# Patient Record
Sex: Male | Born: 1980 | Race: White | Hispanic: No | Marital: Married | Smoking: Never smoker
Health system: Southern US, Community
[De-identification: ages and names within clinical notes are randomized; demographics above are authoritative.]

## PROBLEM LIST (undated history)

## (undated) DIAGNOSIS — N200 Calculus of kidney: Secondary | ICD-10-CM

## (undated) DIAGNOSIS — B019 Varicella without complication: Secondary | ICD-10-CM

## (undated) HISTORY — DX: Varicella without complication: B01.9

## (undated) HISTORY — DX: Calculus of kidney: N20.0

---

## 2008-07-25 ENCOUNTER — Ambulatory Visit: Payer: Self-pay | Admitting: Family Medicine

## 2012-07-13 ENCOUNTER — Encounter: Payer: Self-pay | Admitting: Family Medicine

## 2012-08-03 ENCOUNTER — Ambulatory Visit (INDEPENDENT_AMBULATORY_CARE_PROVIDER_SITE_OTHER): Payer: BC Managed Care – PPO | Admitting: Family Medicine

## 2012-08-03 ENCOUNTER — Encounter: Payer: Self-pay | Admitting: Family Medicine

## 2012-08-03 VITALS — BP 110/76 | HR 65 | Temp 98.0°F | Resp 16 | Ht 72.5 in | Wt 190.6 lb

## 2012-08-03 DIAGNOSIS — Z23 Encounter for immunization: Secondary | ICD-10-CM

## 2012-08-03 DIAGNOSIS — Z0000 Encounter for general adult medical examination without abnormal findings: Secondary | ICD-10-CM

## 2012-08-03 NOTE — Patient Instructions (Addendum)
Your should receive a call or letter about your lab results within the next week to 10 days. If cholesterol is elevated, we can recheck it fasting (8 hours).   Keeping you healthy  Get these tests  Blood pressure- Have your blood pressure checked once a year by your healthcare provider.  Normal blood pressure is 120/80.  Weight- Have your body mass index (BMI) calculated to screen for obesity.  BMI is a measure of body fat based on height and weight. You can also calculate your own BMI at https://www.west-esparza.com/.  Cholesterol- Have your cholesterol checked regularly starting at age 59, sooner may be necessary if you have diabetes, high blood pressure, if a family member developed heart diseases at an early age or if you smoke.   Chlamydia, HIV, and other sexual transmitted disease- Get screened each year until the age of 11 then within three months of each new sexual partner.  Diabetes- Have your blood sugar checked regularly if you have high blood pressure, high cholesterol, a family history of diabetes or if you are overweight.  Get these vaccines  Flu shot- Every fall.  Tetanus shot- Every 10 years.  Menactra- Single dose; prevents meningitis.  Take these steps  Don't smoke- If you do smoke, ask your healthcare provider about quitting. For tips on how to quit, go to www.smokefree.gov or call 1-800-QUIT-NOW.  Be physically active- Exercise 5 days a week for at least 30 minutes.  If you are not already physically active start slow and gradually work up to 30 minutes of moderate physical activity.  Examples of moderate activity include walking briskly, mowing the yard, dancing, swimming bicycling, etc.  Eat a healthy diet- Eat a variety of healthy foods such as fruits, vegetables, low fat milk, low fat cheese, yogurt, lean meats, poultry, fish, beans, tofu, etc.  For more information on healthy eating, go to www.thenutritionsource.org  Drink alcohol in moderation- Limit alcohol  intake two drinks or less a day.  Never drink and drive.  Dentist- Brush and floss teeth twice daily; visit your dentis twice a year.  Depression-Your emotional health is as important as your physical health.  If you're feeling down, losing interest in things you normally enjoy please talk with your healthcare provider.  Gun Safety- If you keep a gun in your home, keep it unloaded and with the safety lock on.  Bullets should be stored separately.  Helmet use- Always wear a helmet when riding a motorcycle, bicycle, rollerblading or skateboarding.  Safe sex- If you may be exposed to a sexually transmitted infection, use a condom  Seat belts- Seat bels can save your life; always wear one.  Smoke/Carbon Monoxide detectors- These detectors need to be installed on the appropriate level of your home.  Replace batteries at least once a year.  Skin Cancer- When out in the sun, cover up and use sunscreen SPF 15 or higher.  Violence- If anyone is threatening or hurting you, please tell your healthcare provider.

## 2012-08-03 NOTE — Progress Notes (Signed)
  Subjective:    Patient ID: Benjamin Jarvis, male    DOB: Jan 12, 1981, 31 y.o.   MRN: [id number]  HPI    Review of Systems  Constitutional: Negative.   HENT: Negative.   Eyes: Negative.   Respiratory: Negative.   Cardiovascular: Negative.   Gastrointestinal: Negative.   Genitourinary: Negative.   Musculoskeletal: Negative.   Skin: Negative.   Neurological: Negative.   Hematological: Negative.   Psychiatric/Behavioral: Negative.        Objective:   Physical Exam        Assessment & Plan:

## 2012-08-03 NOTE — Progress Notes (Signed)
Subjective:    Patient ID: Benjamin Jarvis, male    DOB: Apr 06, 1981, 31 y.o.   MRN: [id number]  HPI Ikaika Bhatia is a 31 y.o. male Here for CPE.  No specific concerns. Last blood work - few years ago - was normal.  Last ate at 12:30.  No regular exercise - sometimes ultimate frisbee.   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  Firefighter.   Review of Systems  All other systems reviewed and are negative.  see CMA note and I reviewed 13 point on PHS - no positive responses.      Objective:   Physical Exam  Constitutional: He is oriented to person, place, and time. He appears well-developed and well-nourished.  HENT:  Head: Normocephalic and atraumatic.  Right Ear: External ear normal.  Left Ear: External ear normal.  Mouth/Throat: Oropharynx is clear and moist.  Eyes: Conjunctivae normal and EOM are normal. Pupils are equal, round, and reactive to light.  Neck: Normal range of motion. Neck supple. No thyromegaly present.  Cardiovascular: Normal rate, regular rhythm, normal heart sounds and intact distal pulses.   Pulmonary/Chest: Effort normal and breath sounds normal. No respiratory distress. He has no wheezes.  Abdominal: Soft. He exhibits no distension. There is no tenderness. Hernia confirmed negative in the right inguinal area and confirmed negative in the left inguinal area.  Genitourinary: Testes normal and penis normal. Right testis shows no mass and no tenderness. Left testis shows no tenderness.  Musculoskeletal: Normal range of motion. He exhibits no edema and no tenderness.  Lymphadenopathy:    He has no cervical adenopathy.  Neurological: He is alert and oriented to person, place, and time. He has normal reflexes.  Skin: Skin is warm and dry.  Psychiatric: He has a normal mood and affect. His behavior is normal.        Assessment & Plan:  Kaige Evins is a 31 y.o. male 1. Routine general medical examination at a health care facility  Tdap vaccine greater than or  equal to 7yo IM, CBC, Comprehensive metabolic panel, Lipid panel    CPE - declined flu vaccine.   tdap given today. Anticipatory guidance below.  No concerns identified. Check labs above.  May need to recheck lipids 8 hours fasting if elevated.   Discussed increase exercise.    Patient Instructions  Your should receive a call or letter about your lab results within the next week to 10 days. If cholesterol is elevated, we can recheck it fasting (8 hours).   Keeping you healthy  Get these tests  Blood pressure- Have your blood pressure checked once a year by your healthcare provider.  Normal blood pressure is 120/80.  Weight- Have your body mass index (BMI) calculated to screen for obesity.  BMI is a measure of body fat based on height and weight. You can also calculate your own BMI at https://www.west-esparza.com/.  Cholesterol- Have your cholesterol checked regularly starting at age 35, sooner may be necessary if you have diabetes, high blood pressure, if a family member developed heart diseases at an early age or if you smoke.   Chlamydia, HIV, and other sexual transmitted disease- Get screened each year until the age of 38 then within three months of each new sexual partner.  Diabetes- Have your blood sugar checked regularly if you have high blood pressure, high cholesterol, a family history of diabetes or if you are overweight.  Get these vaccines  Flu shot- Every fall.  Tetanus shot- Every 10 years.  Menactra-  Single dose; prevents meningitis.  Take these steps  Don't smoke- If you do smoke, ask your healthcare provider about quitting. For tips on how to quit, go to www.smokefree.gov or call 1-800-QUIT-NOW.  Be physically active- Exercise 5 days a week for at least 30 minutes.  If you are not already physically active start slow and gradually work up to 30 minutes of moderate physical activity.  Examples of moderate activity include walking briskly, mowing the yard, dancing,  swimming bicycling, etc.  Eat a healthy diet- Eat a variety of healthy foods such as fruits, vegetables, low fat milk, low fat cheese, yogurt, lean meats, poultry, fish, beans, tofu, etc.  For more information on healthy eating, go to www.thenutritionsource.org  Drink alcohol in moderation- Limit alcohol intake two drinks or less a day.  Never drink and drive.  Dentist- Brush and floss teeth twice daily; visit your dentis twice a year.  Depression-Your emotional health is as important as your physical health.  If you're feeling down, losing interest in things you normally enjoy please talk with your healthcare provider.  Gun Safety- If you keep a gun in your home, keep it unloaded and with the safety lock on.  Bullets should be stored separately.  Helmet use- Always wear a helmet when riding a motorcycle, bicycle, rollerblading or skateboarding.  Safe sex- If you may be exposed to a sexually transmitted infection, use a condom  Seat belts- Seat bels can save your life; always wear one.  Smoke/Carbon Monoxide detectors- These detectors need to be installed on the appropriate level of your home.  Replace batteries at least once a year.  Skin Cancer- When out in the sun, cover up and use sunscreen SPF 15 or higher.  Violence- If anyone is threatening or hurting you, please tell your healthcare provider.

## 2012-08-04 LAB — LIPID PANEL
Cholesterol: 216 mg/dL — ABNORMAL HIGH (ref 0–200)
HDL: 49 mg/dL
LDL Cholesterol: 146 mg/dL — ABNORMAL HIGH (ref 0–99)
Total CHOL/HDL Ratio: 4.4 ratio
Triglycerides: 105 mg/dL
VLDL: 21 mg/dL (ref 0–40)

## 2012-08-04 LAB — COMPREHENSIVE METABOLIC PANEL
ALT: 21 U/L (ref 0–53)
CO2: 29 mEq/L (ref 19–32)
Sodium: 139 mEq/L (ref 135–145)
Total Bilirubin: 0.6 mg/dL (ref 0.3–1.2)
Total Protein: 7.7 g/dL (ref 6.0–8.3)

## 2012-08-04 LAB — CBC
MCH: 30.4 pg (ref 26.0–34.0)
Platelets: 218 10*3/uL (ref 150–400)
RBC: 5.14 MIL/uL (ref 4.22–5.81)
WBC: 5.1 10*3/uL (ref 4.0–10.5)

## 2012-10-04 ENCOUNTER — Ambulatory Visit: Payer: BC Managed Care – PPO | Admitting: Internal Medicine

## 2012-10-04 ENCOUNTER — Ambulatory Visit: Payer: BC Managed Care – PPO

## 2012-10-04 VITALS — BP 126/80 | HR 83 | Temp 97.8°F | Resp 16 | Ht 73.5 in | Wt 192.2 lb

## 2012-10-04 DIAGNOSIS — R079 Chest pain, unspecified: Secondary | ICD-10-CM

## 2012-10-04 NOTE — Progress Notes (Signed)
  Subjective:    Patient ID: Benjamin Jarvis, male    DOB: 1981-08-16, 32 y.o.   MRN: [id number]  HPI complaining of chest pain started last night while he was sleeping Could not find a comfortable position except for leaning forward No shortness of breath No cough No palpitations/no diaphoresis/no nausea or vomiting Got better as he moves around walking his dog but then worse as he was sitting in church. No history of injury although was moving heavy furniture on Friday night Main area of discomfort is the left anterolateral chest area below the nipple  No medications Nonsmoker  Review of Systems No recent illnesses in the last month No fatigue or weight loss no fever or night sweats No nausea or vomiting     Objective:   Physical Exam No acute distress although in obvious discomfort Vital signs stable HEENT clear Heart regular without murmur Lungs clear with full breath sounds in all areas Thoracic spine nontender/paraspinous muscles nontender He is not actually tender over the chest wall where he notices his pain   [facility] reading (PRIMARY) by  Dr. Merla Riches no pneumothorax, infiltrate, or effusion. No rib abnormality.  EKG normal with no signs of pericarditis       Assessment & Plan:  Problem #1 chest pain Etiology unclear Advise that is likely musculoskeletal and should be treated with heat and 800 mg of ibuprofen 3 times a day with close observation for any other symptoms

## 2014-09-27 ENCOUNTER — Ambulatory Visit (INDEPENDENT_AMBULATORY_CARE_PROVIDER_SITE_OTHER): Payer: BC Managed Care – PPO | Admitting: Family Medicine

## 2014-09-27 VITALS — BP 118/72 | HR 97 | Temp 98.2°F | Resp 16 | Ht 73.25 in | Wt 190.0 lb

## 2014-09-27 DIAGNOSIS — N509 Disorder of male genital organs, unspecified: Secondary | ICD-10-CM

## 2014-09-27 DIAGNOSIS — R102 Pelvic and perineal pain: Secondary | ICD-10-CM

## 2014-09-27 DIAGNOSIS — N410 Acute prostatitis: Secondary | ICD-10-CM

## 2014-09-27 DIAGNOSIS — R319 Hematuria, unspecified: Secondary | ICD-10-CM

## 2014-09-27 LAB — POCT UA - MICROSCOPIC ONLY
BACTERIA, U MICROSCOPIC: NEGATIVE
CRYSTALS, UR, HPF, POC: NEGATIVE
Casts, Ur, LPF, POC: NEGATIVE
Mucus, UA: NEGATIVE
YEAST UA: NEGATIVE

## 2014-09-27 LAB — POCT URINALYSIS DIPSTICK
Bilirubin, UA: NEGATIVE
GLUCOSE UA: NEGATIVE
KETONES UA: NEGATIVE
Leukocytes, UA: NEGATIVE
Nitrite, UA: NEGATIVE
Protein, UA: NEGATIVE
SPEC GRAV UA: 1.01
UROBILINOGEN UA: 0.2
pH, UA: 6.5

## 2014-09-27 LAB — POCT CBC
Granulocyte percent: 70.1 %G (ref 37–80)
HCT, POC: 48.8 % (ref 43.5–53.7)
HEMOGLOBIN: 16.1 g/dL (ref 14.1–18.1)
LYMPH, POC: 2.2 (ref 0.6–3.4)
MCH, POC: 29.9 pg (ref 27–31.2)
MCHC: 33.1 g/dL (ref 31.8–35.4)
MCV: 90.5 fL (ref 80–97)
MID (cbc): 0.4 (ref 0–0.9)
MPV: 8.2 fL (ref 0–99.8)
POC GRANULOCYTE: 6 (ref 2–6.9)
POC LYMPH PERCENT: 25.6 %L (ref 10–50)
POC MID %: 4.3 % (ref 0–12)
Platelet Count, POC: 221 10*3/uL (ref 142–424)
RBC: 5.39 M/uL (ref 4.69–6.13)
RDW, POC: 12.5 %
WBC: 8.5 10*3/uL (ref 4.6–10.2)

## 2014-09-27 MED ORDER — CIPROFLOXACIN HCL 500 MG PO TABS: 500.0000 mg | ORAL_TABLET | Freq: Two times a day (BID) | ORAL | Status: DC

## 2014-09-27 NOTE — Progress Notes (Signed)
Subjective:  This chart was scribed for Benjamin Staggers, MD by Benjamin Jarvis, ED Scribe. This patient was seen at [facility] and the patient's care was started at 7:56 PM   Patient ID: Benjamin Jarvis, male    DOB: 04-14-1981, 33 y.o.   MRN: [id number]  HPI  HPI Comments: [name] is a 33 y.o. male who presents to the Emergency Department complaining of intermittent pain between his anus and his scrotum that radiates to his testicles, started 2 days ago and became worse last night. He describes episodes as stabbing pain that occur once an hour and become worse with coughing. Pt states dysuria as an associated symptom. He denies a history of similar symptoms and reports that current pain is not consistent with his history of kidney stones. Pt is married and denies partners outside of the marriage. He denies recent travel. Pt also denies fever, hematuria, penile discharge and pain with BM as associated symptoms.   There are no active problems to display for this patient.  History reviewed. No pertinent past medical history. History reviewed. No pertinent past surgical history. No Known Allergies Prior to Admission medications   Not on File   History   Social History  . Marital Status: Married    Spouse Name: N/A    Number of Children: N/A  . Years of Education: N/A   Occupational History  . Not on file.   Social History Main Topics  . Smoking status: Never Smoker   . Smokeless tobacco: Not on file  . Alcohol Use: Yes  . Drug Use: No  . Sexual Activity: Not on file   Other Topics Concern  . Not on file   Social History Narrative    Review of Systems  Constitutional: Negative for fever.  Genitourinary: Positive for dysuria and testicular pain. Negative for hematuria and discharge.  All other systems reviewed and are negative.      Objective:   Physical Exam  Constitutional: He appears well-developed and well-nourished. No distress.  HENT:  Head: Normocephalic and  atraumatic.  Eyes: Conjunctivae and EOM are normal.  Neck: Neck supple. No tracheal deviation present.  Cardiovascular: Normal rate.   Pulmonary/Chest: Effort normal. No respiratory distress.  Abdominal: Soft. There is no tenderness.  No CVA tenderness; abdomen soft and non-tender, but feels sensation in perineal area with pressure on lower abdomen  Genitourinary:  Testicles non-tender; no rash; no penile discharge; no inguinal LAD  Skin: Skin is warm and dry.  Psychiatric: He has a normal mood and affect. His behavior is normal.  Nursing note and vitals reviewed.      Filed Vitals:   09/27/14 1905  BP: 118/72  Pulse: 97  Temp: 98.2 F (36.8 C)  TempSrc: Oral  Resp: 16  Height: 6' 1.25" (1.861 m)  Weight: 190 lb (86.183 kg)  SpO2: 97%   Results for orders placed or performed in visit on 09/27/14  POCT urinalysis dipstick  Result Value Ref Range   Color, UA yellow    Clarity, UA clear    Glucose, UA neg    Bilirubin, UA neg    Ketones, UA neg    Spec Grav, UA 1.010    Blood, UA trace    pH, UA 6.5    Protein, UA neg    Urobilinogen, UA 0.2    Nitrite, UA neg    Leukocytes, UA Negative   POCT UA - Microscopic Only  Result Value Ref Range   WBC, Ur, HPF, POC  0-1    RBC, urine, microscopic 0-2    Bacteria, U Microscopic neg    Mucus, UA neg    Epithelial cells, urine per micros 0-1    Crystals, Ur, HPF, POC neg    Casts, Ur, LPF, POC neg    Yeast, UA neg   POCT CBC  Result Value Ref Range   WBC 8.5 4.6 - 10.2 K/uL   Lymph, poc 2.2 0.6 - 3.4   POC LYMPH PERCENT 25.6 10 - 50 %L   MID (cbc) 0.4 0 - 0.9   POC MID % 4.3 0 - 12 %M   POC Granulocyte 6.0 2 - 6.9   Granulocyte percent 70.1 37 - 80 %G   RBC 5.39 4.69 - 6.13 M/uL   Hemoglobin 16.1 14.1 - 18.1 g/dL   HCT, POC 08.648.8 57.843.5 - 53.7 %   MCV 90.5 80 - 97 fL   MCH, POC 29.9 27 - 31.2 pg   MCHC 33.1 31.8 - 35.4 g/dL   RDW, POC 46.912.5 %   Platelet Count, POC 221 142 - 424 K/uL   MPV 8.2 0 - 99.8 fL         Assessment & Plan:   Benjamin AlcideDaniel Marcello is a 33 y.o. male Hematuria - Plan: POCT urinalysis dipstick, POCT UA - Microscopic Only  Acute prostatitis - Plan: ciprofloxacin (CIPRO) 500 MG tablet, PSA, POCT CBC  Perineal pain in male - Plan: POCT CBC  Suspected acute prostatitis by hx and urinary symptoms. Afebrile and without retention symptoms.   -start cipro, push fluids, check PSA.   -rtc precautions discussed if any worsening or abdominal symptoms.   Meds ordered this encounter  Medications  . ciprofloxacin (CIPRO) 500 MG tablet    Sig: Take 1 tablet (500 mg total) by mouth 2 (two) times daily.    Dispense:  20 tablet    Refill:  0   Patient Instructions  Your pain appears to be due to acute prostate infection. Start Cipro, we will check PSA blood test. If not improving in next 3-4 days, or any worsening sooner - return for recheck.  Return to the clinic or go to the nearest emergency room if any of your symptoms worsen or new symptoms occur.  Prostatitis The prostate gland is about the size and shape of a walnut. It is located just below your bladder. It produces one of the components of semen, which is made up of sperm and the fluids that help nourish and transport it out from the testicles. Prostatitis is inflammation of the prostate gland.  There are four types of prostatitis:  Acute bacterial prostatitis. This is the least common type of prostatitis. It starts quickly and usually is associated with a bladder infection, high fever, and shaking chills. It can occur at any age.  Chronic bacterial prostatitis. This is a persistent bacterial infection in the prostate. It usually develops from repeated acute bacterial prostatitis or acute bacterial prostatitis that was not properly treated. It can occur in men of any age but is most common in middle-aged men whose prostate has begun to enlarge. The symptoms are not as severe as those in acute bacterial prostatitis. Discomfort in the part  of your body that is in front of your rectum and below your scrotum (perineum), lower abdomen, or in the head of your penis (glans) may represent your primary discomfort.  Chronic prostatitis (nonbacterial). This is the most common type of prostatitis. It is inflammation of the prostate gland that  is not caused by a bacterial infection. The cause is unknown and may be associated with a viral infection or autoimmune disorder.  Prostatodynia (pelvic floor disorder). This is associated with increased muscular tone in the pelvis surrounding the prostate. CAUSES The causes of bacterial prostatitis are bacterial infection. The causes of the other types of prostatitis are unknown.  SYMPTOMS  Symptoms can vary depending upon the type of prostatitis that exists. There can also be overlap in symptoms. Possible symptoms for each type of prostatitis are listed below. Acute Bacterial Prostatitis  Painful urination.  Fever or chills.  Muscle or joint pains.  Low back pain.  Low abdominal pain.  Inability to empty bladder completely. Chronic Bacterial Prostatitis, Chronic Nonbacterial Prostatitis, and Prostatodynia  Sudden urge to urinate.  Frequent urination.  Difficulty starting urine stream.  Weak urine stream.  Discharge from the urethra.  Dribbling after urination.  Rectal pain.  Pain in the testicles, penis, or tip of the penis.  Pain in the perineum.  Problems with sexual function.  Painful ejaculation.  Bloody semen. DIAGNOSIS  In order to diagnose prostatitis, your health care provider will ask about your symptoms. One or more urine samples will be taken and tested (urinalysis). If the urinalysis result is negative for bacteria, your health care provider may use a finger to feel your prostate (digital rectal exam). This exam helps your health care provider determine if your prostate is swollen and tender. It will also produce a specimen of semen that can be  analyzed. TREATMENT  Treatment for prostatitis depends on the cause. If a bacterial infection is the cause, it can be treated with antibiotic medicine. In cases of chronic bacterial prostatitis, the use of antibiotics for up to 1 month or 6 weeks may be necessary. Your health care provider may instruct you to take sitz baths to help relieve pain. A sitz bath is a bath of hot water in which your hips and buttocks are under water. This relaxes the pelvic floor muscles and often helps to relieve the pressure on your prostate. HOME CARE INSTRUCTIONS   Take all medicines as directed by your health care provider.  Take sitz baths as directed by your health care provider. SEEK MEDICAL CARE IF:   Your symptoms get worse, not better.  You have a fever. SEEK IMMEDIATE MEDICAL CARE IF:   You have chills.  You feel nauseous or vomit.  You feel lightheaded or faint.  You are unable to urinate.  You have blood or blood clots in your urine. MAKE SURE YOU:  Understand these instructions.  Will watch your condition.  Will get help right away if you are not doing well or get worse. Document Released: 09/13/2000 Document Revised: 09/21/2013 Document Reviewed: 04/05/2013 [facility] Patient Information 2015 [place], [place]. This information is not intended to replace advice given to you by your health care provider. Make sure you discuss any questions you have with your health care provider.     I personally performed the services described in this documentation, which was scribed in my presence. The recorded information has been reviewed and considered, and addended by me as needed.

## 2014-09-27 NOTE — Patient Instructions (Signed)
Your pain appears to be due to acute prostate infection. Start Cipro, we will check PSA blood test. If not improving in next 3-4 days, or any worsening sooner - return for recheck.  Return to the clinic or go to the nearest emergency room if any of your symptoms worsen or new symptoms occur.  Prostatitis The prostate gland is about the size and shape of a walnut. It is located just below your bladder. It produces one of the components of semen, which is made up of sperm and the fluids that help nourish and transport it out from the testicles. Prostatitis is inflammation of the prostate gland.  There are four types of prostatitis:  Acute bacterial prostatitis. This is the least common type of prostatitis. It starts quickly and usually is associated with a bladder infection, high fever, and shaking chills. It can occur at any age.  Chronic bacterial prostatitis. This is a persistent bacterial infection in the prostate. It usually develops from repeated acute bacterial prostatitis or acute bacterial prostatitis that was not properly treated. It can occur in men of any age but is most common in middle-aged men whose prostate has begun to enlarge. The symptoms are not as severe as those in acute bacterial prostatitis. Discomfort in the part of your body that is in front of your rectum and below your scrotum (perineum), lower abdomen, or in the head of your penis (glans) may represent your primary discomfort.  Chronic prostatitis (nonbacterial). This is the most common type of prostatitis. It is inflammation of the prostate gland that is not caused by a bacterial infection. The cause is unknown and may be associated with a viral infection or autoimmune disorder.  Prostatodynia (pelvic floor disorder). This is associated with increased muscular tone in the pelvis surrounding the prostate. CAUSES The causes of bacterial prostatitis are bacterial infection. The causes of the other types of prostatitis are  unknown.  SYMPTOMS  Symptoms can vary depending upon the type of prostatitis that exists. There can also be overlap in symptoms. Possible symptoms for each type of prostatitis are listed below. Acute Bacterial Prostatitis  Painful urination.  Fever or chills.  Muscle or joint pains.  Low back pain.  Low abdominal pain.  Inability to empty bladder completely. Chronic Bacterial Prostatitis, Chronic Nonbacterial Prostatitis, and Prostatodynia  Sudden urge to urinate.  Frequent urination.  Difficulty starting urine stream.  Weak urine stream.  Discharge from the urethra.  Dribbling after urination.  Rectal pain.  Pain in the testicles, penis, or tip of the penis.  Pain in the perineum.  Problems with sexual function.  Painful ejaculation.  Bloody semen. DIAGNOSIS  In order to diagnose prostatitis, your health care provider will ask about your symptoms. One or more urine samples will be taken and tested (urinalysis). If the urinalysis result is negative for bacteria, your health care provider may use a finger to feel your prostate (digital rectal exam). This exam helps your health care provider determine if your prostate is swollen and tender. It will also produce a specimen of semen that can be analyzed. TREATMENT  Treatment for prostatitis depends on the cause. If a bacterial infection is the cause, it can be treated with antibiotic medicine. In cases of chronic bacterial prostatitis, the use of antibiotics for up to 1 month or 6 weeks may be necessary. Your health care provider may instruct you to take sitz baths to help relieve pain. A sitz bath is a bath of hot water in which your  hips and buttocks are under water. This relaxes the pelvic floor muscles and often helps to relieve the pressure on your prostate. HOME CARE INSTRUCTIONS   Take all medicines as directed by your health care provider.  Take sitz baths as directed by your health care provider. SEEK MEDICAL  CARE IF:   Your symptoms get worse, not better.  You have a fever. SEEK IMMEDIATE MEDICAL CARE IF:   You have chills.  You feel nauseous or vomit.  You feel lightheaded or faint.  You are unable to urinate.  You have blood or blood clots in your urine. MAKE SURE YOU:  Understand these instructions.  Will watch your condition.  Will get help right away if you are not doing well or get worse. Document Released: 09/13/2000 Document Revised: 09/21/2013 Document Reviewed: 04/05/2013 [facility] Patient Information 2015 [place], [place]. This information is not intended to replace advice given to you by your health care provider. Make sure you discuss any questions you have with your health care provider.

## 2014-09-29 LAB — PSA: PSA: 0.56 ng/mL (ref <=4.00)

## 2014-10-01 ENCOUNTER — Telehealth: Payer: Self-pay

## 2014-10-01 NOTE — Telephone Encounter (Signed)
Patient was advised to come in a be seen today but he would like to receive a call from Dr. Neva Seat before he comes. Please call patient! [phone]

## 2014-10-02 ENCOUNTER — Encounter: Payer: Self-pay | Admitting: Family Medicine

## 2014-10-02 ENCOUNTER — Ambulatory Visit (INDEPENDENT_AMBULATORY_CARE_PROVIDER_SITE_OTHER): Payer: 59 | Admitting: Family Medicine

## 2014-10-02 VITALS — BP 118/76 | HR 96 | Temp 98.0°F | Resp 18 | Ht 73.75 in | Wt 187.4 lb

## 2014-10-02 DIAGNOSIS — N509 Disorder of male genital organs, unspecified: Secondary | ICD-10-CM

## 2014-10-02 DIAGNOSIS — J029 Acute pharyngitis, unspecified: Secondary | ICD-10-CM

## 2014-10-02 DIAGNOSIS — J039 Acute tonsillitis, unspecified: Secondary | ICD-10-CM

## 2014-10-02 DIAGNOSIS — R102 Pelvic and perineal pain: Secondary | ICD-10-CM

## 2014-10-02 DIAGNOSIS — R509 Fever, unspecified: Secondary | ICD-10-CM

## 2014-10-02 LAB — POCT CBC
Granulocyte percent: 76.8 %G (ref 37–80)
HEMATOCRIT: 46.1 % (ref 43.5–53.7)
Hemoglobin: 15.1 g/dL (ref 14.1–18.1)
Lymph, poc: 1.4 (ref 0.6–3.4)
MCH, POC: 29.5 pg (ref 27–31.2)
MCHC: 32.7 g/dL (ref 31.8–35.4)
MCV: 90.2 fL (ref 80–97)
MID (CBC): 0.6 (ref 0–0.9)
MPV: 7.3 fL (ref 0–99.8)
POC Granulocyte: 6.4 (ref 2–6.9)
POC LYMPH PERCENT: 16.3 %L (ref 10–50)
POC MID %: 6.9 %M (ref 0–12)
Platelet Count, POC: 191 10*3/uL (ref 142–424)
RBC: 5.11 M/uL (ref 4.69–6.13)
RDW, POC: 12.1 %
WBC: 8.3 10*3/uL (ref 4.6–10.2)

## 2014-10-02 LAB — POCT RAPID STREP A (OFFICE): RAPID STREP A SCREEN: NEGATIVE

## 2014-10-02 MED ORDER — AMOXICILLIN 500 MG PO CAPS: 500.0000 mg | ORAL_CAPSULE | Freq: Three times a day (TID) | ORAL | Status: DC

## 2014-10-02 NOTE — Progress Notes (Signed)
Subjective:    Patient ID: Benjamin Jarvis, male    DOB: 04-23-1981, 34 y.o.   MRN: [id number] This chart was scribed for [name], MD by Jolene Provost, Medical Scribe. This patient was seen in Room 2 and the patient's care was started a 11:57 AM.  HPI  HPI Comments: Benjamin Jarvis is a 34 y.o. male who presents to [facility] complaining of sore throat that started one week ago. Pt also states he has been congested and coughing for the last three weeks. Pt endorses fever (101.1, two days ago), congestion, diaphoresis, and sleep disturbance. Pt has taken ibuprofen, sudafed, benadryl, and Ambien once without relief.   Pt was last seen five days ago with perineal pain. Normal UA, but based on description sounded like acute prostatitis. Started on cipro 500mg  for 10 days. PSA did return as being normal. Pt states his perineal pain has resolved - resolved after 1st dose or two of antibiotic.  Pt denies pain with wiping anus, or blood in stool or with wiping.   Review of Systems  Constitutional: Positive for fever, chills and diaphoresis.  HENT: Positive for congestion and sore throat.   Respiratory: Positive for cough.   Psychiatric/Behavioral: Positive for sleep disturbance.       Objective:   Physical Exam  Constitutional: He is oriented to person, place, and time. He appears well-developed and well-nourished.  HENT:  Head: Normocephalic and atraumatic.  Right Ear: Tympanic membrane, external ear and ear canal normal.  Left Ear: Tympanic membrane, external ear and ear canal normal.  Nose: No rhinorrhea.  Mouth/Throat: Mucous membranes are normal. No posterior oropharyngeal edema or posterior oropharyngeal erythema.  Tonsils 1-2+, erythematous with exudates.  Eyes: Conjunctivae are normal. Pupils are equal, round, and reactive to light.  Neck: Neck supple.  Slightly enlarged submandibular lymph node. No other adenopathy.  Cardiovascular: Normal rate, regular rhythm, normal heart sounds  and intact distal pulses.   No murmur heard. Pulmonary/Chest: Effort normal and breath sounds normal. No respiratory distress. He has no wheezes. He has no rhonchi. He has no rales.  Abdominal: Soft. There is no hepatosplenomegaly. There is no tenderness.  Lymphadenopathy:    He has no cervical adenopathy.    He has no axillary adenopathy.       Right: No supraclavicular and no epitrochlear adenopathy present.       Left: No supraclavicular and no epitrochlear adenopathy present.  Neurological: He is alert and oriented to person, place, and time.  Skin: Skin is warm and dry. No rash noted.  Psychiatric: He has a normal mood and affect. His behavior is normal.  Vitals reviewed. adherent white exudate -L greater than right tonsilar.    Filed Vitals:   10/02/14 1113  BP: 118/76  Pulse: 96  Temp: 98 F (36.7 C)  TempSrc: Oral  Resp: 18  Height: 6' 1.75" (1.873 m)  Weight: 187 lb 6.4 oz (85.004 kg)  SpO2: 98%   Results for orders placed or performed in visit on 10/02/14  POCT rapid strep A  Result Value Ref Range   Rapid Strep A Screen Negative Negative  POCT CBC  Result Value Ref Range   WBC 8.3 4.6 - 10.2 K/uL   Lymph, poc 1.4 0.6 - 3.4   POC LYMPH PERCENT 16.3 10 - 50 %L   MID (cbc) 0.6 0 - 0.9   POC MID % 6.9 0 - 12 %M   POC Granulocyte 6.4 2 - 6.9   Granulocyte percent 76.8 37 -  80 %G   RBC 5.11 4.69 - 6.13 M/uL   Hemoglobin 15.1 14.1 - 18.1 g/dL   HCT, POC 40.9 81.1 - 53.7 %   MCV 90.2 80 - 97 fL   MCH, POC 29.5 27 - 31.2 pg   MCHC 32.7 31.8 - 35.4 g/dL   RDW, POC 91.4 %   Platelet Count, POC 191 142 - 424 K/uL   MPV 7.3 0 - 99.8 fL        Assessment & Plan:   Benjamin Jarvis is a 34 y.o. male Sore throat - Plan: POCT rapid strep A, Culture, Group A Strep, POCT CBC, Epstein-Barr virus VCA antibody panel, amoxicillin (AMOXIL) 500 MG capsule, Tonsillitis - Plan: Epstein-Barr virus VCA antibody panel, Fever, unspecified fever cause - Plan: POCT rapid strep A,  Culture, Group A Strep, POCT CBC, Epstein-Barr virus VCA antibody panel, amoxicillin (AMOXIL) 500 MG capsule  - exudative tonsillitis. Possible false negative rapid strep vs viral, including mono. Equivocal CBC.   -check throat cx, and EBV titer, start amox (warned about rash if mono - rtc if this occurs), sx care and RTC precautions.   Perineal pain in male  - now resolved, and with quick resolution and nl PSA, unlikely prostatitis. Did admit to possible hemorrhoid, but asx now.   -if pain recurs - especially off abx - rtc for recheck and likely repeat exam and rectal exam.  Sooner if worse.    Meds ordered this encounter  Medications  . amoxicillin (AMOXIL) 500 MG capsule    Sig: Take 1 capsule (500 mg total) by mouth 3 (three) times daily.    Dispense:  30 capsule    Refill:  0   Patient Instructions  Stop Cipro as your prostate test was normal and symptoms have resolved.  If the pain returns - recheck to determine cause.   For your throat: I will check a mono test, and throat culture to see if other type of strep throat. Can start amoxicillin in case this is strep throat, but if any rash - this could be due to mono - return if this occurs. Return to the clinic or go to the nearest emergency room if any of your symptoms worsen or new symptoms occur.   Sore Throat A sore throat is pain, burning, irritation, or scratchiness of the throat. There is often pain or tenderness when swallowing or talking. A sore throat may be accompanied by other symptoms, such as coughing, sneezing, fever, and swollen neck glands. A sore throat is often the first sign of another sickness, such as a cold, flu, strep throat, or mononucleosis (commonly known as mono). Most sore throats go away without medical treatment. CAUSES  The most common causes of a sore throat include:  A viral infection, such as a cold, flu, or mono.  A bacterial infection, such as strep throat, tonsillitis, or whooping  cough.  Seasonal allergies.  Dryness in the air.  Irritants, such as smoke or pollution.  Gastroesophageal reflux disease (GERD). HOME CARE INSTRUCTIONS   Only take over-the-counter medicines as directed by your caregiver.  Drink enough fluids to keep your urine clear or pale yellow.  Rest as needed.  Try using throat sprays, lozenges, or sucking on hard candy to ease any pain (if older than 4 years or as directed).  Sip warm liquids, such as broth, herbal tea, or warm water with honey to relieve pain temporarily. You may also eat or drink cold or frozen liquids such as frozen  ice pops.  Gargle with salt water (mix 1 tsp salt with 8 oz of water).  Do not smoke and avoid secondhand smoke.  Put a cool-mist humidifier in your bedroom at night to moisten the air. You can also turn on a hot shower and sit in the bathroom with the door closed for 5-10 minutes. SEEK IMMEDIATE MEDICAL CARE IF:  You have difficulty breathing.  You are unable to swallow fluids, soft foods, or your saliva.  You have increased swelling in the throat.  Your sore throat does not get better in 7 days.  You have nausea and vomiting.  You have a fever or persistent symptoms for more than 2-3 days.  You have a fever and your symptoms suddenly get worse. MAKE SURE YOU:   Understand these instructions.  Will watch your condition.  Will get help right away if you are not doing well or get worse. Document Released: 10/24/2004 Document Revised: 09/02/2012 Document Reviewed: 05/24/2012 [facility] Patient Information 2015 [place], [place]. This information is not intended to replace advice given to you by your health care provider. Make sure you discuss any questions you have with your health care provider.          I personally performed the services described in this documentation, which was scribed in my presence. The recorded information has been reviewed and considered, and addended by me as  needed.

## 2014-10-02 NOTE — Patient Instructions (Addendum)
Stop Cipro as your prostate test was normal and symptoms have resolved.  If the pain returns - recheck to determine cause.   For your throat: I will check a mono test, and throat culture to see if other type of strep throat. Can start amoxicillin in case this is strep throat, but if any rash - this could be due to mono - return if this occurs. Return to the clinic or go to the nearest emergency room if any of your symptoms worsen or new symptoms occur.   Sore Throat A sore throat is pain, burning, irritation, or scratchiness of the throat. There is often pain or tenderness when swallowing or talking. A sore throat may be accompanied by other symptoms, such as coughing, sneezing, fever, and swollen neck glands. A sore throat is often the first sign of another sickness, such as a cold, flu, strep throat, or mononucleosis (commonly known as mono). Most sore throats go away without medical treatment. CAUSES  The most common causes of a sore throat include:  A viral infection, such as a cold, flu, or mono.  A bacterial infection, such as strep throat, tonsillitis, or whooping cough.  Seasonal allergies.  Dryness in the air.  Irritants, such as smoke or pollution.  Gastroesophageal reflux disease (GERD). HOME CARE INSTRUCTIONS   Only take over-the-counter medicines as directed by your caregiver.  Drink enough fluids to keep your urine clear or pale yellow.  Rest as needed.  Try using throat sprays, lozenges, or sucking on hard candy to ease any pain (if older than 4 years or as directed).  Sip warm liquids, such as broth, herbal tea, or warm water with honey to relieve pain temporarily. You may also eat or drink cold or frozen liquids such as frozen ice pops.  Gargle with salt water (mix 1 tsp salt with 8 oz of water).  Do not smoke and avoid secondhand smoke.  Put a cool-mist humidifier in your bedroom at night to moisten the air. You can also turn on a hot shower and sit in the  bathroom with the door closed for 5-10 minutes. SEEK IMMEDIATE MEDICAL CARE IF:  You have difficulty breathing.  You are unable to swallow fluids, soft foods, or your saliva.  You have increased swelling in the throat.  Your sore throat does not get better in 7 days.  You have nausea and vomiting.  You have a fever or persistent symptoms for more than 2-3 days.  You have a fever and your symptoms suddenly get worse. MAKE SURE YOU:   Understand these instructions.  Will watch your condition.  Will get help right away if you are not doing well or get worse. Document Released: 10/24/2004 Document Revised: 09/02/2012 Document Reviewed: 05/24/2012 [facility] Patient Information 2015 [place], [place]. This information is not intended to replace advice given to you by your health care provider. Make sure you discuss any questions you have with your health care provider.

## 2014-10-03 LAB — EPSTEIN-BARR VIRUS VCA ANTIBODY PANEL
EBV EA IgG: <5 U/mL (ref <9.0)
EBV NA IGG: 132 U/mL — AB (ref <18.0)
EBV VCA IgG: 32.5 U/mL — ABNORMAL HIGH (ref <18.0)

## 2014-10-03 NOTE — Telephone Encounter (Signed)
Pt was seen on 10/02/14

## 2014-10-04 LAB — CULTURE, GROUP A STREP: Organism ID, Bacteria: NORMAL

## 2014-10-06 ENCOUNTER — Telehealth: Payer: Self-pay | Admitting: *Deleted

## 2014-10-06 NOTE — Telephone Encounter (Signed)
Patient called requesting lab results

## 2014-10-07 ENCOUNTER — Telehealth: Payer: Self-pay

## 2014-10-07 NOTE — Telephone Encounter (Signed)
Patient aware Dr Neva SeatGreene is out for the next couple days and is requesting if another provider could please review his lab results. Patients wife is sick and he feels she may have what he had. Patients call back number is [phone]

## 2014-10-07 NOTE — Telephone Encounter (Signed)
Lab call completed.

## 2014-10-15 ENCOUNTER — Other Ambulatory Visit: Payer: Self-pay | Admitting: Family Medicine

## 2014-10-15 ENCOUNTER — Ambulatory Visit (INDEPENDENT_AMBULATORY_CARE_PROVIDER_SITE_OTHER): Payer: 59 | Admitting: Family Medicine

## 2014-10-15 VITALS — BP 106/70 | HR 85 | Temp 98.3°F | Resp 18 | Ht 73.0 in | Wt 182.2 lb

## 2014-10-15 DIAGNOSIS — R1032 Left lower quadrant pain: Secondary | ICD-10-CM

## 2014-10-15 DIAGNOSIS — R102 Pelvic and perineal pain: Secondary | ICD-10-CM

## 2014-10-15 DIAGNOSIS — N509 Disorder of male genital organs, unspecified: Secondary | ICD-10-CM

## 2014-10-15 DIAGNOSIS — R197 Diarrhea, unspecified: Secondary | ICD-10-CM

## 2014-10-15 LAB — POCT URINALYSIS DIPSTICK
Bilirubin, UA: NEGATIVE
Blood, UA: NEGATIVE
Glucose, UA: NEGATIVE
Ketones, UA: NEGATIVE
LEUKOCYTES UA: NEGATIVE
NITRITE UA: NEGATIVE
Protein, UA: NEGATIVE
Spec Grav, UA: 1.02
UROBILINOGEN UA: 0.2
pH, UA: 7

## 2014-10-15 LAB — POCT CBC
Granulocyte percent: 62.6 %G (ref 37–80)
HEMATOCRIT: 47.4 % (ref 43.5–53.7)
Hemoglobin: 16 g/dL (ref 14.1–18.1)
LYMPH, POC: 1.6 (ref 0.6–3.4)
MCH, POC: 29.6 pg (ref 27–31.2)
MCHC: 33.9 g/dL (ref 31.8–35.4)
MCV: 87.5 fL (ref 80–97)
MID (cbc): 0.5 (ref 0–0.9)
MPV: 7.6 fL (ref 0–99.8)
PLATELET COUNT, POC: 280 10*3/uL (ref 142–424)
POC Granulocyte: 3.5 (ref 2–6.9)
POC LYMPH %: 29.1 % (ref 10–50)
POC MID %: 8.3 % (ref 0–12)
RBC: 5.41 M/uL (ref 4.69–6.13)
RDW, POC: 12.1 %
WBC: 5.6 10*3/uL (ref 4.6–10.2)

## 2014-10-15 LAB — POCT UA - MICROSCOPIC ONLY
Bacteria, U Microscopic: NEGATIVE
CASTS, UR, LPF, POC: NEGATIVE
Crystals, Ur, HPF, POC: NEGATIVE
Epithelial cells, urine per micros: NEGATIVE
MUCUS UA: 1
RBC, URINE, MICROSCOPIC: NEGATIVE
Yeast, UA: NEGATIVE

## 2014-10-15 NOTE — Patient Instructions (Addendum)
Return with the urine and stool tests today - we close at 4pm.  You should receive a call or letter about your lab results early next week, but if pain not improving in next 2 days - call and I will order a CT scan to look into other possible causes of your pain.  Return to the clinic or go to the nearest emergency room if any of your symptoms worsen or new symptoms occur.  Abdominal Pain Many things can cause abdominal pain. Usually, abdominal pain is not caused by a disease and will improve without treatment. It can often be observed and treated at home. Your health care provider will do a physical exam and possibly order blood tests and X-rays to help determine the seriousness of your pain. However, in many cases, more time must pass before a clear cause of the pain can be found. Before that point, your health care provider may not know if you need more testing or further treatment. HOME CARE INSTRUCTIONS  Monitor your abdominal pain for any changes. The following actions may help to alleviate any discomfort you are experiencing:  Only take over-the-counter or prescription medicines as directed by your health care provider.  Do not take laxatives unless directed to do so by your health care provider.  Try a clear liquid diet (broth, tea, or water) as directed by your health care provider. Slowly move to a bland diet as tolerated. SEEK MEDICAL CARE IF:  You have unexplained abdominal pain.  You have abdominal pain associated with nausea or diarrhea.  You have pain when you urinate or have a bowel movement.  You experience abdominal pain that wakes you in the night.  You have abdominal pain that is worsened or improved by eating food.  You have abdominal pain that is worsened with eating fatty foods.  You have a fever. SEEK IMMEDIATE MEDICAL CARE IF:   Your pain does not go away within 2 hours.  You keep throwing up (vomiting).  Your pain is felt only in portions of the abdomen,  such as the right side or the left lower portion of the abdomen.  You pass bloody or black tarry stools. MAKE SURE YOU:  Understand these instructions.   Will watch your condition.   Will get help right away if you are not doing well or get worse.  Document Released: 06/26/2005 Document Revised: 09/21/2013 Document Reviewed: 05/26/2013 [facility] Patient Information 2015 [place], [place]. This information is not intended to replace advice given to you by your health care provider. Make sure you discuss any questions you have with your health care provider. Diarrhea Diarrhea is frequent loose and watery bowel movements. It can cause you to feel weak and dehydrated. Dehydration can cause you to become tired and thirsty, have a dry mouth, and have decreased urination that often is dark yellow. Diarrhea is a sign of another problem, most often an infection that will not last long. In most cases, diarrhea typically lasts 2-3 days. However, it can last longer if it is a sign of something more serious. It is important to treat your diarrhea as directed by your caregiver to lessen or prevent future episodes of diarrhea. CAUSES  Some common causes include:  Gastrointestinal infections caused by viruses, bacteria, or parasites.  Food poisoning or food allergies.  Certain medicines, such as antibiotics, chemotherapy, and laxatives.  Artificial sweeteners and fructose.  Digestive disorders. HOME CARE INSTRUCTIONS  Ensure adequate fluid intake (hydration): Have 1 cup (8 oz) of fluid  for each diarrhea episode. Avoid fluids that contain simple sugars or sports drinks, fruit juices, whole milk products, and sodas. Your urine should be clear or pale yellow if you are drinking enough fluids. Hydrate with an oral rehydration solution that you can purchase at pharmacies, retail stores, and online. You can prepare an oral rehydration solution at home by mixing the following ingredients together:   - tsp  table salt.   tsp baking soda.   tsp salt substitute containing potassium chloride.  1  tablespoons sugar.  1 L (34 oz) of water.  Certain foods and beverages may increase the speed at which food moves through the gastrointestinal (GI) tract. These foods and beverages should be avoided and include:  Caffeinated and alcoholic beverages.  High-fiber foods, such as raw fruits and vegetables, nuts, seeds, and whole grain breads and cereals.  Foods and beverages sweetened with sugar alcohols, such as xylitol, sorbitol, and mannitol.  Some foods may be well tolerated and may help thicken stool including:  Starchy foods, such as rice, toast, pasta, low-sugar cereal, oatmeal, grits, baked potatoes, crackers, and bagels.  Bananas.  Applesauce.  Add probiotic-rich foods to help increase healthy bacteria in the GI tract, such as yogurt and fermented milk products.  Wash your hands well after each diarrhea episode.  Only take over-the-counter or prescription medicines as directed by your caregiver.  Take a warm bath to relieve any burning or pain from frequent diarrhea episodes. SEEK IMMEDIATE MEDICAL CARE IF:   You are unable to keep fluids down.  You have persistent vomiting.  You have blood in your stool, or your stools are black and tarry.  You do not urinate in 6-8 hours, or there is only a small amount of very dark urine.  You have abdominal pain that increases or localizes.  You have weakness, dizziness, confusion, or light-headedness.  You have a severe headache.  Your diarrhea gets worse or does not get better.  You have a fever or persistent symptoms for more than 2-3 days.  You have a fever and your symptoms suddenly get worse. MAKE SURE YOU:   Understand these instructions.  Will watch your condition.  Will get help right away if you are not doing well or get worse. Document Released: 09/06/2002 Document Revised: 01/31/2014 Document Reviewed:  05/24/2012 [facility] Patient Information 2015 [place], [place]. This information is not intended to replace advice given to you by your health care provider. Make sure you discuss any questions you have with your health care provider.

## 2014-10-15 NOTE — Progress Notes (Addendum)
Subjective:    Patient ID: Benjamin Jarvis, male    DOB: Feb 19, 1981, 34 y.o.   MRN: [id number] This chart was scribed for [name], MD by Littie Deeds, Medical Scribe. This patient was seen in Room 9 and the patient's care was started at 10:31 AM.   HPI HPI Comments: Benjamin Jarvis is a 34 y.o. male who presents to the [facility] for a follow-up for perineal pain.  He was last here Jan 3. I initially saw him for perineal pain on 09/27/14. Initially suspected acute prostatitis. Started on Cipro 500 mg for 10 days, but his PSA returned as normal. Pain resolved after 2 doses of abx. He was seen last visit for sore throat, negative strep test and strep culture. EBV test indicated past infection, but IgM negative.  Sore Throat: Patient states his sore throat has resolved, but he reports having dry cough and congestion. He notes that he had mono during his senior year of high school, about 15 years ago.  Perineal Pain/Gastrointestinal Symptoms: Patient reports having intermittent GI symptoms, including constipation, watery diarrhea, perineal pain with releasing gas, slight testicular pain, mild nausea, lower back pain, and lower abdominal pain that started 2 days ago. The following day, he notes having sharp pain in the perineal region. He states he had several bowel movements this morning, which was unusual. He notes having about 6 episodes of diarrhea in the past 24 hours, and had 1 somewhat normal bowel movement. Patient reports that he had diaphoresis in his pelvic region last night. Patient stopped Cipro after a few days and started intermittently taking amoxicillin for a few days. He has missed doses of his amoxicillin occasionally. Patient denies fever. He also denies recent travel.  Later in history -did admit to extramarital sexual encounter about 7 months ago. Unprotected intercourse once. Has not discussed with wife yet. Has marital counselor - last seen about a year ago  - plans on possibly discussing this in session with counselor. Has not had STI testing since that encounter.   There are no active problems to display for this patient.  History reviewed. No pertinent past medical history. History reviewed. No pertinent past surgical history. No Known Allergies Prior to Admission medications   Medication Sig Start Date End Date Taking? Authorizing Provider  amoxicillin (AMOXIL) 500 MG capsule Take 1 capsule (500 mg total) by mouth 3 (three) times daily. 10/02/14  Yes Shade Flood, MD  ciprofloxacin (CIPRO) 500 MG tablet Take 1 tablet (500 mg total) by mouth 2 (two) times daily. 09/27/14  Yes Shade Flood, MD   History   Social History  . Marital Status: Married    Spouse Name: N/A    Number of Children: N/A  . Years of Education: N/A   Occupational History  . Not on file.   Social History Main Topics  . Smoking status: Never Smoker   . Smokeless tobacco: Not on file  . Alcohol Use: Yes  . Drug Use: No  . Sexual Activity: Not on file   Other Topics Concern  . Not on file   Social History Narrative     Review of Systems  Constitutional: Positive for diaphoresis. Negative for fever.  HENT: Positive for congestion. Negative for sore throat.   Respiratory: Positive for cough.   Gastrointestinal: Positive for nausea, abdominal pain, diarrhea and constipation.  Genitourinary: Positive for testicular pain.  Musculoskeletal: Positive for back pain.       Objective:  Physical Exam  Constitutional: He is oriented to person, place, and time. He appears well-developed and well-nourished. No distress.  HENT:  Head: Normocephalic and atraumatic.  Mouth/Throat: Oropharynx is clear and moist. No oropharyngeal exudate.  Eyes: Pupils are equal, round, and reactive to light.  Neck: Neck supple.  Cardiovascular: Normal rate, regular rhythm and normal heart sounds.  Exam reveals no gallop and no friction rub.   No murmur  heard. Pulmonary/Chest: Effort normal and breath sounds normal. No respiratory distress. He has no wheezes. He has no rales.  CTAB.  Abdominal: Bowel sounds are increased. There is tenderness in the suprapubic area and left lower quadrant. There is no CVA tenderness.  Slightly hyperactive bowel sounds.  Genitourinary: Rectal exam shows no external hemorrhoid, no internal hemorrhoid and no fissure. Prostate is not enlarged and not tender. Right testis shows no tenderness. Left testis shows no tenderness. No discharge found.  No hernia palpated. Testicles nontender. No rash. No discharge. No inguinal LAD. Rectal: No apparent enlarged hemorrhoids or fissures. Prostate nontender without appreciable enlargement. No tenderness with palpation of perineum.  Musculoskeletal: He exhibits no edema.  Lymphadenopathy:       Right: No inguinal adenopathy present.       Left: No inguinal adenopathy present.  Neurological: He is alert and oriented to person, place, and time. No cranial nerve deficit.  Skin: Skin is warm and dry. No rash noted.  Psychiatric: He has a normal mood and affect. His behavior is normal.  Vitals reviewed.     Filed Vitals:   10/15/14 0915  BP: 106/70  Pulse: 85  Temp: 98.3 F (36.8 C)  TempSrc: Oral  Resp: 18  Height:  (1.854 m)  Weight: 182 lb 3.2 oz (82.645 kg)  SpO2: 98%   Results for orders placed or performed in visit on 10/15/14  POCT CBC  Result Value Ref Range   WBC 5.6 4.6 - 10.2 K/uL   Lymph, poc 1.6 0.6 - 3.4   POC LYMPH PERCENT 29.1 10 - 50 %L   MID (cbc) 0.5 0 - 0.9   POC MID % 8.3 0 - 12 %M   POC Granulocyte 3.5 2 - 6.9   Granulocyte percent 62.6 37 - 80 %G   RBC 5.41 4.69 - 6.13 M/uL   Hemoglobin 16.0 14.1 - 18.1 g/dL   HCT, POC 45.4 09.8 - 53.7 %   MCV 87.5 80 - 97 fL   MCH, POC 29.6 27 - 31.2 pg   MCHC 33.9 31.8 - 35.4 g/dL   RDW, POC 11.9 %   Platelet Count, POC 280 142 - 424 K/uL   MPV 7.6 0 - 99.8 fL  POCT urinalysis dipstick   Result Value Ref Range   Color, UA yellow    Clarity, UA clear    Glucose, UA neg    Bilirubin, UA neg    Ketones, UA neg    Spec Grav, UA 1.020    Blood, UA neg    pH, UA 7.0    Protein, UA neg    Urobilinogen, UA 0.2    Nitrite, UA neg    Leukocytes, UA Negative   POCT UA - Microscopic Only  Result Value Ref Range   WBC, Ur, HPF, POC 0-1    RBC, urine, microscopic neg    Bacteria, U Microscopic neg    Mucus, UA 1    Epithelial cells, urine per micros negative    Crystals, Ur, HPF, POC negative  Casts, Ur, LPF, POC negative    Yeast, UA negative        Assessment & Plan:  Treyshawn Muldrew is a 34 y.o. male Perineal pain in male - Plan: PSA, HIV antibody, RPR, GC/Chlamydia Probe Amp, Abdominal pain, LLQ - Plan: PSA, POCT CBC, POCT urinalysis dipstick, POCT UA - Microscopic Only, Clostridium difficile EIA, Abdominal pain, suprapubic, left - Plan: PSA, POCT CBC, POCT urinalysis dipstick, POCT UA - Microscopic Only, Clostridium difficile EIA Diarrhea - Plan: Clostridium difficile EIA  - recurrence of perineal pain after resolution of pain after few doses of Cipro at end of December. Now recurred past 2 days with associated diarrhea, pain with flatus, and now lower abdominal pain.  Reassuring CBC, and UA.  No apparent prostate ttp, but will recheck PSA to R/o prostatitis.   -DDX includes colitis/diverticultis/proctitis. Discussed CT abd/pelvis - recommended to have this performed in next few days if not improving. He would like to defer this if possible at this time. Will check C Diff as recent antibiotic use. No new antibiotics at this time.   -bland foods, fluids, Immodium if needed. declined pain rx - has hydrocodone if needed, but discussed if persists next 2 days - CT scanning recommended. He will call with status by  Monday morning - if not improved by then - can order CT abd pelvis with contrast to R/o colitis/diverticulitis.  RTC/ER precautions if worse.    -based on  additional history of new sexual contact 7 months ago, will check STI testing.  Advised to discuss with spouse, but he will decide timing.  Recommended discussing with their marriage counselor, but if I can help with this discussion, can rtc with spouse to discuss if needed. STI tests today should be sufficient as 7 months since encounter.    Patient Instructions  Return with the urine and stool tests today - we close at 4pm.  You should receive a call or letter about your lab results early next week, but if pain not improving in next 2 days - call and I will order a CT scan to look into other possible causes of your pain.  Return to the clinic or go to the nearest emergency room if any of your symptoms worsen or new symptoms occur.  Abdominal Pain Many things can cause abdominal pain. Usually, abdominal pain is not caused by a disease and will improve without treatment. It can often be observed and treated at home. Your health care provider will do a physical exam and possibly order blood tests and X-rays to help determine the seriousness of your pain. However, in many cases, more time must pass before a clear cause of the pain can be found. Before that point, your health care provider may not know if you need more testing or further treatment. HOME CARE INSTRUCTIONS  Monitor your abdominal pain for any changes. The following actions may help to alleviate any discomfort you are experiencing:  Only take over-the-counter or prescription medicines as directed by your health care provider.  Do not take laxatives unless directed to do so by your health care provider.  Try a clear liquid diet (broth, tea, or water) as directed by your health care provider. Slowly move to a bland diet as tolerated. SEEK MEDICAL CARE IF:  You have unexplained abdominal pain.  You have abdominal pain associated with nausea or diarrhea.  You have pain when you urinate or have a bowel movement.  You experience  abdominal pain that wakes you  in the night.  You have abdominal pain that is worsened or improved by eating food.  You have abdominal pain that is worsened with eating fatty foods.  You have a fever. SEEK IMMEDIATE MEDICAL CARE IF:   Your pain does not go away within 2 hours.  You keep throwing up (vomiting).  Your pain is felt only in portions of the abdomen, such as the right side or the left lower portion of the abdomen.  You pass bloody or black tarry stools. MAKE SURE YOU:  Understand these instructions.   Will watch your condition.   Will get help right away if you are not doing well or get worse.  Document Released: 06/26/2005 Document Revised: 09/21/2013 Document Reviewed: 05/26/2013 [facility] Patient Information 2015 [place], [place]. This information is not intended to replace advice given to you by your health care provider. Make sure you discuss any questions you have with your health care provider. Diarrhea Diarrhea is frequent loose and watery bowel movements. It can cause you to feel weak and dehydrated. Dehydration can cause you to become tired and thirsty, have a dry mouth, and have decreased urination that often is dark yellow. Diarrhea is a sign of another problem, most often an infection that will not last long. In most cases, diarrhea typically lasts 2-3 days. However, it can last longer if it is a sign of something more serious. It is important to treat your diarrhea as directed by your caregiver to lessen or prevent future episodes of diarrhea. CAUSES  Some common causes include:  Gastrointestinal infections caused by viruses, bacteria, or parasites.  Food poisoning or food allergies.  Certain medicines, such as antibiotics, chemotherapy, and laxatives.  Artificial sweeteners and fructose.  Digestive disorders. HOME CARE INSTRUCTIONS  Ensure adequate fluid intake (hydration): Have 1 cup (8 oz) of fluid for each diarrhea episode. Avoid fluids that  contain simple sugars or sports drinks, fruit juices, whole milk products, and sodas. Your urine should be clear or pale yellow if you are drinking enough fluids. Hydrate with an oral rehydration solution that you can purchase at pharmacies, retail stores, and online. You can prepare an oral rehydration solution at home by mixing the following ingredients together:   - tsp table salt.   tsp baking soda.   tsp salt substitute containing potassium chloride.  1  tablespoons sugar.  1 L (34 oz) of water.  Certain foods and beverages may increase the speed at which food moves through the gastrointestinal (GI) tract. These foods and beverages should be avoided and include:  Caffeinated and alcoholic beverages.  High-fiber foods, such as raw fruits and vegetables, nuts, seeds, and whole grain breads and cereals.  Foods and beverages sweetened with sugar alcohols, such as xylitol, sorbitol, and mannitol.  Some foods may be well tolerated and may help thicken stool including:  Starchy foods, such as rice, toast, pasta, low-sugar cereal, oatmeal, grits, baked potatoes, crackers, and bagels.  Bananas.  Applesauce.  Add probiotic-rich foods to help increase healthy bacteria in the GI tract, such as yogurt and fermented milk products.  Wash your hands well after each diarrhea episode.  Only take over-the-counter or prescription medicines as directed by your caregiver.  Take a warm bath to relieve any burning or pain from frequent diarrhea episodes. SEEK IMMEDIATE MEDICAL CARE IF:   You are unable to keep fluids down.  You have persistent vomiting.  You have blood in your stool, or your stools are black and tarry.  You do not  urinate in 6-8 hours, or there is only a small amount of very dark urine.  You have abdominal pain that increases or localizes.  You have weakness, dizziness, confusion, or light-headedness.  You have a severe headache.  Your diarrhea gets worse or does not  get better.  You have a fever or persistent symptoms for more than 2-3 days.  You have a fever and your symptoms suddenly get worse. MAKE SURE YOU:   Understand these instructions.  Will watch your condition.  Will get help right away if you are not doing well or get worse. Document Released: 09/06/2002 Document Revised: 01/31/2014 Document Reviewed: 05/24/2012 [facility] Patient Information 2015 [place], [place]. This information is not intended to replace advice given to you by your health care provider. Make sure you discuss any questions you have with your health care provider.    I personally performed the services described in this documentation, which was scribed in my presence. The recorded information has been reviewed and considered, and addended by me as needed.

## 2014-10-15 NOTE — Addendum Note (Signed)
Addended by: Thelma BargeICHARDSON, La Dibella D on: 10/15/2014 05:21 PM   Modules accepted: Orders

## 2014-10-16 ENCOUNTER — Other Ambulatory Visit: Payer: Self-pay | Admitting: Family Medicine

## 2014-10-17 ENCOUNTER — Telehealth: Payer: Self-pay

## 2014-10-17 DIAGNOSIS — R1032 Left lower quadrant pain: Secondary | ICD-10-CM

## 2014-10-17 DIAGNOSIS — R102 Pelvic and perineal pain: Secondary | ICD-10-CM

## 2014-10-17 LAB — C. DIFFICILE GDH AND TOXIN A/B
C. DIFFICILE GDH: NOT DETECTED
C. difficile Toxin A/B: NOT DETECTED

## 2014-10-17 LAB — PSA: PSA: 0.58 ng/mL (ref <=4.00)

## 2014-10-17 LAB — HIV ANTIBODY (ROUTINE TESTING W REFLEX): HIV: NONREACTIVE

## 2014-10-17 LAB — RPR

## 2014-10-17 LAB — GC/CHLAMYDIA PROBE AMP
CT PROBE, AMP APTIMA: NEGATIVE
GC Probe RNA: NEGATIVE

## 2014-10-17 NOTE — Telephone Encounter (Signed)
Dr Neva SeatGreene  Patient called per your request.  He would like to talk with you regarding his OV and possible CSAN.  [phone]

## 2014-10-18 ENCOUNTER — Telehealth: Payer: Self-pay

## 2014-10-18 NOTE — Telephone Encounter (Signed)
Please advise if pt is recommended to still have CT Scan- he wanted to wait for the lab results. However, pt is still complaining of very irregular stool and pattern, complaints of abdominal discomfort. Please advise lab results to report to pt. I will work on the CT scheduling once order is placed if you would like me to.

## 2014-10-18 NOTE — Telephone Encounter (Signed)
Alvino Chapelllen [facility] called wanting a call back regarding Jaspreet's Cat scan. Please advise at [phone]

## 2014-10-18 NOTE — Telephone Encounter (Signed)
CT Ordered. Today if possible.  Labs ok - see lab notes.

## 2014-10-18 NOTE — Telephone Encounter (Signed)
Pt scheduled for CT Scan tomorrow at 3pm. Contrast waiting for pt pick up. Pt advised of lab results and directions for test.

## 2014-10-19 ENCOUNTER — Telehealth: Payer: Self-pay | Admitting: *Deleted

## 2014-10-19 ENCOUNTER — Ambulatory Visit
Admission: RE | Admit: 2014-10-19 | Discharge: 2014-10-19 | Disposition: A | Discharge status: Home / Self Care | Payer: 59 | Source: Ambulatory Visit | Attending: Family Medicine | Admitting: Family Medicine

## 2014-10-19 DIAGNOSIS — R102 Pelvic and perineal pain: Secondary | ICD-10-CM

## 2014-10-19 DIAGNOSIS — R1032 Left lower quadrant pain: Secondary | ICD-10-CM

## 2014-10-19 MED ORDER — IOHEXOL 300 MG/ML  SOLN
100.0000 mL | Freq: Once | INTRAMUSCULAR | Status: AC | PRN
  Administered 2014-10-19: 100 mL via INTRAVENOUS

## 2014-10-19 NOTE — Telephone Encounter (Signed)
Insurance pending review for this patient.  mem id [id number] Group [id number] Case [id number] [employer]  [phone]

## 2014-10-19 NOTE — Telephone Encounter (Signed)
Received approval for this CT, auth # [id number]. Notified [facility] and forwarding to Referrals.

## 2014-10-19 NOTE — Telephone Encounter (Signed)
Received approval for this CT, auth # [id number]. Notified [facility] and forwarding to Referrals. 

## 2016-05-27 IMAGING — CT CT ABD-PELV W/ CM
2 of 4 series · 17 of 46 positions shown, 19 images · IV contrast (READICAT/WATER & [ID] OMNI 300)
Comparison: None.

CLINICAL DATA: One week history of left lower quadrant and perineal
region pain

EXAM:
CT ABDOMEN AND PELVIS WITH CONTRAST
TECHNIQUE: Multidetector CT imaging of the abdomen and pelvis was performed
using the standard protocol following bolus administration of
intravenous contrast. Oral contrast was also administered.
CONTRAST:  100mL OMNIPAQUE IOHEXOL 300 MG/ML  SOLN

[Series 2: abd/pelvis with · axial · 0.74mm/px · z∈[-416,-11]mm · 14 of 89 slices shown, 16 images]
[im 4/89  soft-tissue]
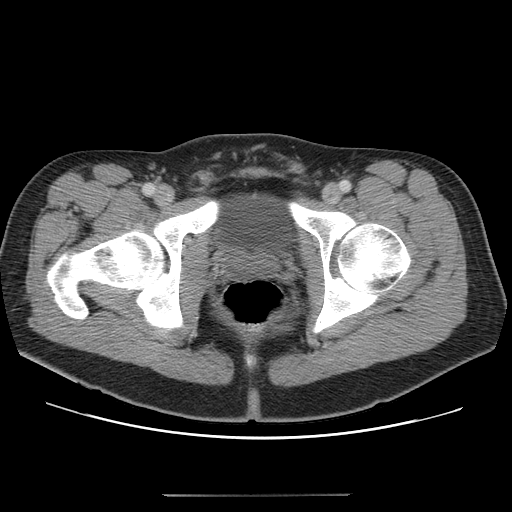
[im 4/89  bone]
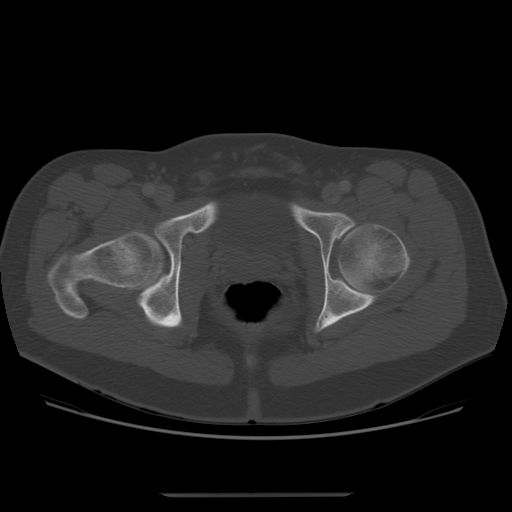
[im 12/89  soft-tissue]
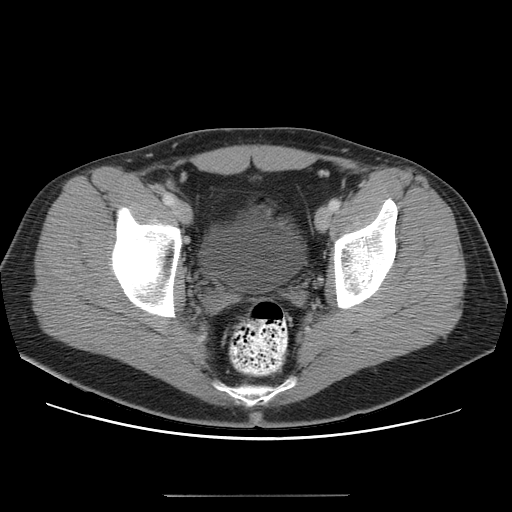
[im 16/89  soft-tissue]
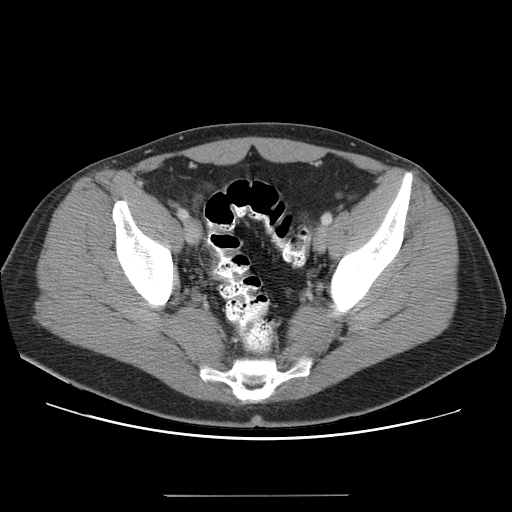
[im 23/89  soft-tissue]
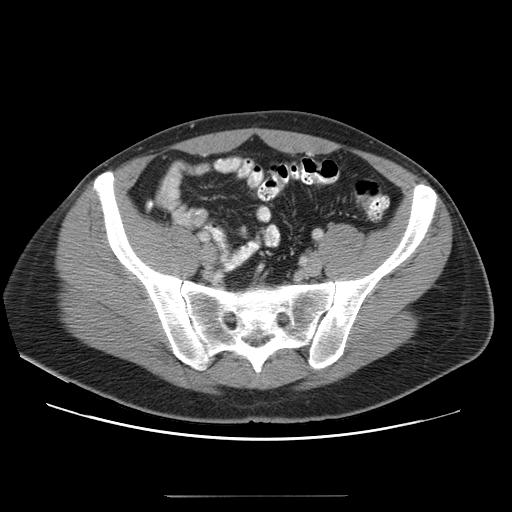
[im 31/89  soft-tissue]
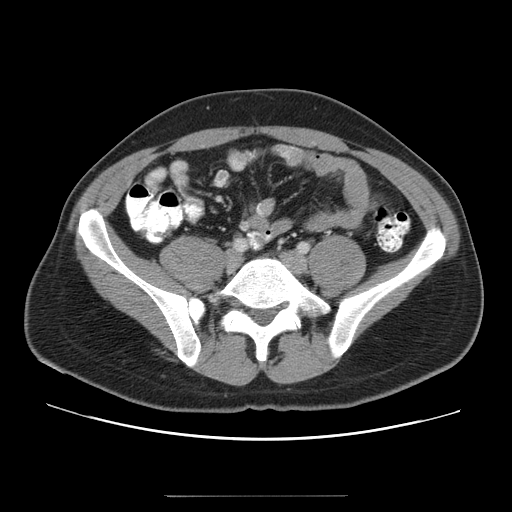
[im 35/89  soft-tissue]
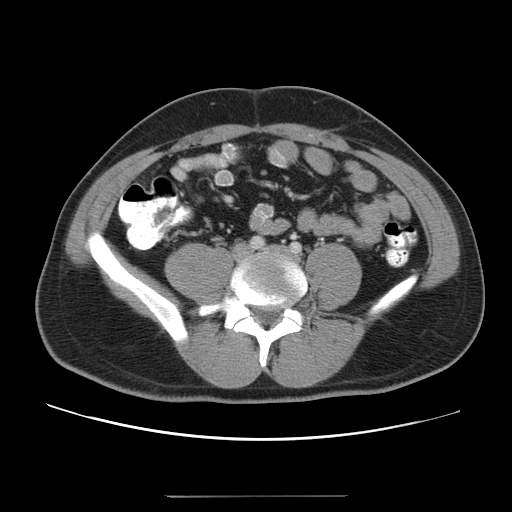
[im 43/89  soft-tissue]
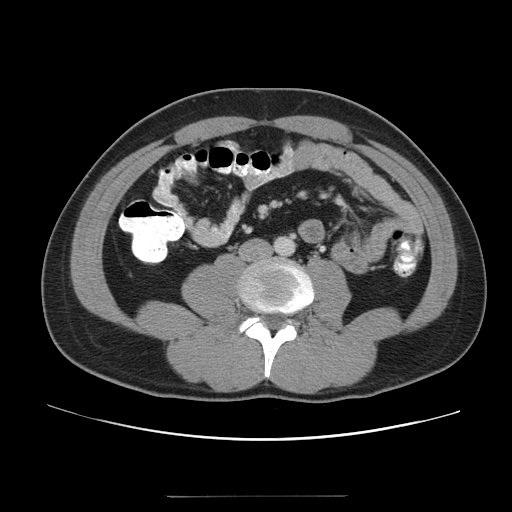
[im 46/89  soft-tissue]
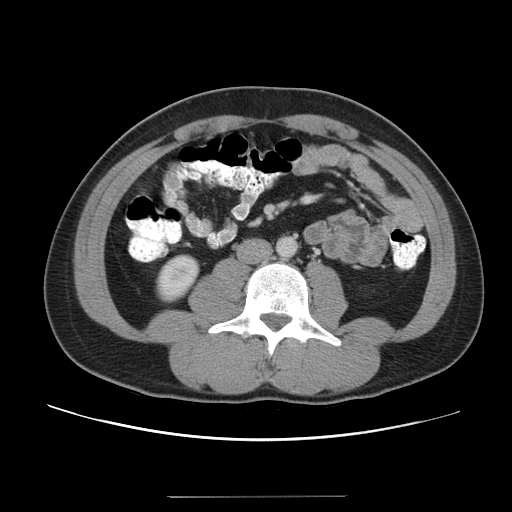
[im 54/89  soft-tissue]
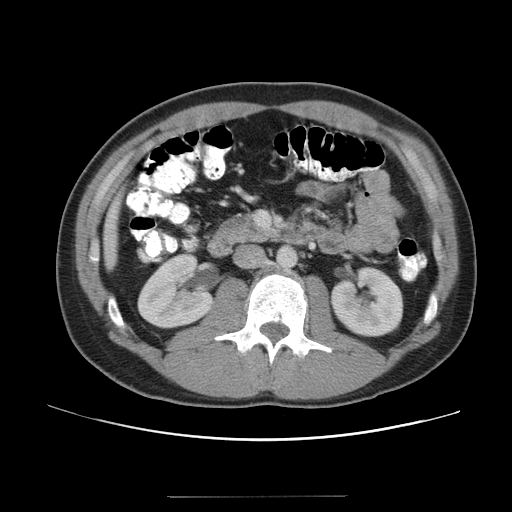
[im 54/89  bone]
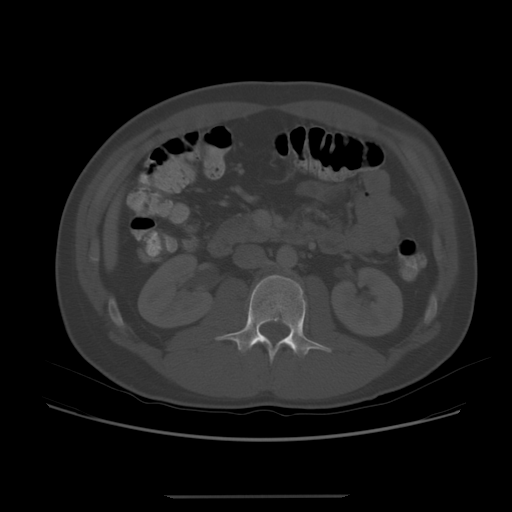
[im 58/89  soft-tissue]
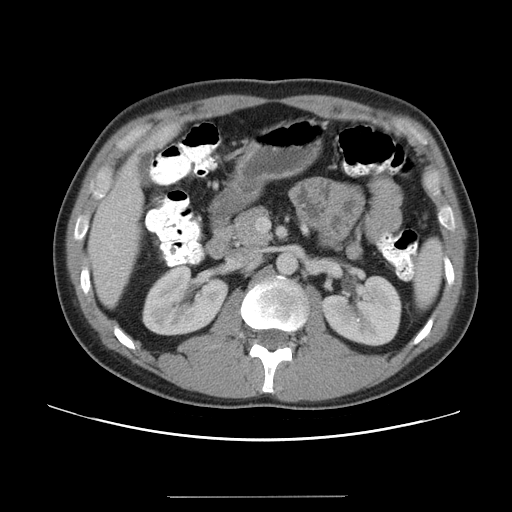
[im 66/89  soft-tissue]
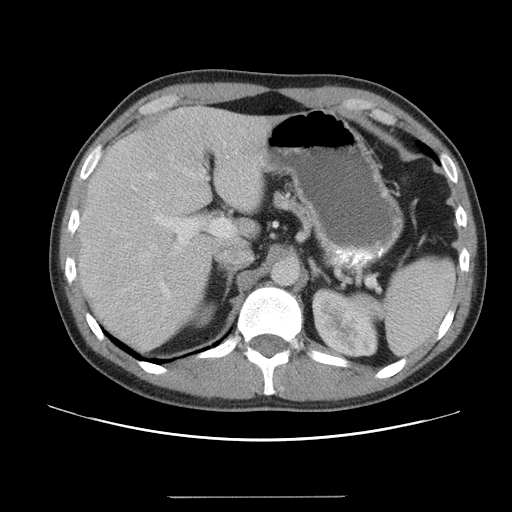
[im 73/89  soft-tissue]
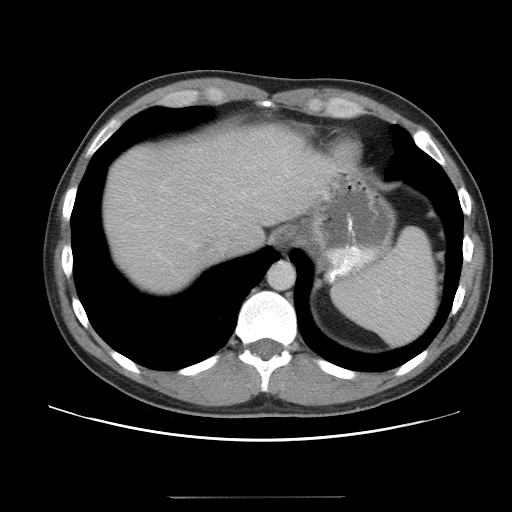
[im 77/89  soft-tissue]
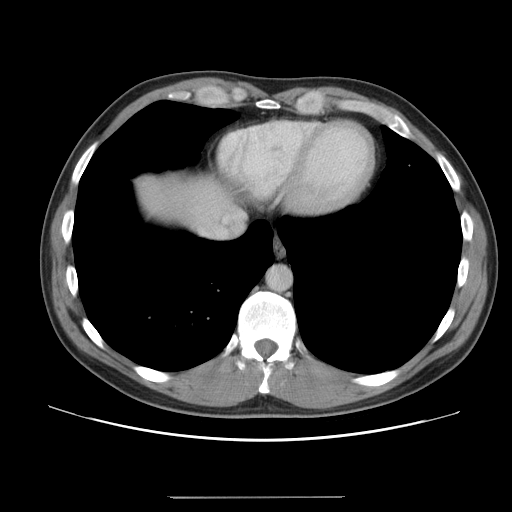
[im 85/89  soft-tissue]
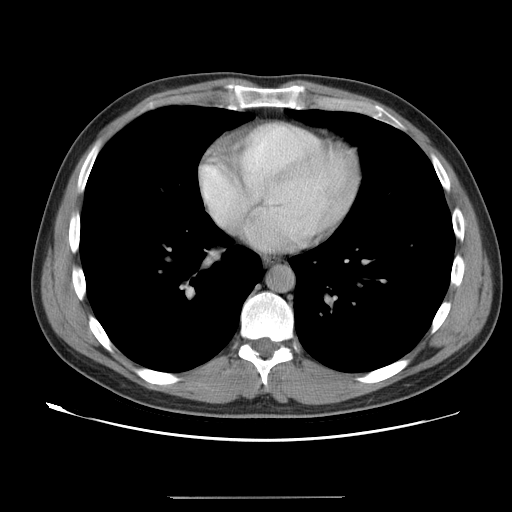

[Series 400: cor · coronal · 1.07mm/px · 3 of 137 slices shown]
[im 46/137  soft-tissue]
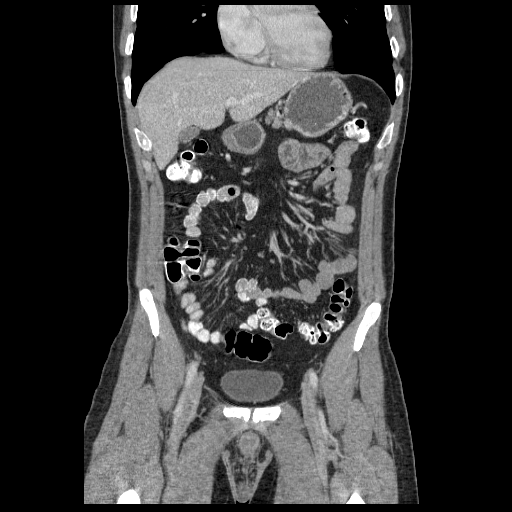
[im 61/137  soft-tissue]
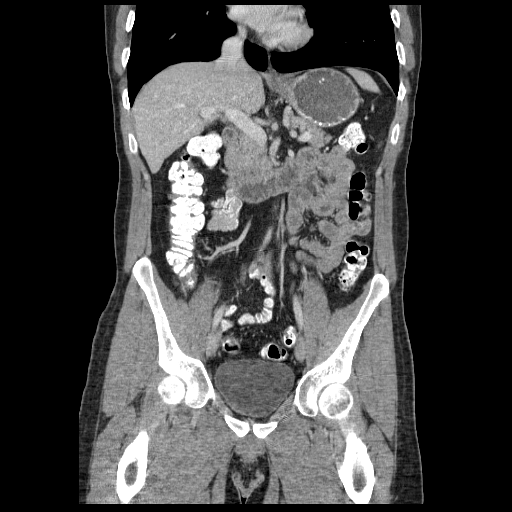
[im 76/137  soft-tissue]
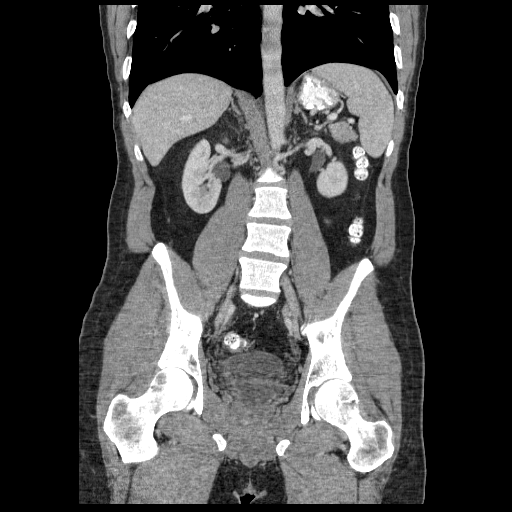

[17 of 46 positions shown; findings below may reference images not displayed]

FINDINGS: Lung bases are clear. No focal liver lesions are identified.
Gallbladder wall is not appreciably thickened. There is no biliary
duct dilatation.

Spleen, pancreas, and adrenals appear normal. Kidneys bilaterally
show no mass or hydronephrosis on either side. There is a 1 mm
calculus in the mid left kidney, nonobstructing. No other renal
calculi are identified. There is no ureteral calculus on either
side. There is a circumaortic left renal vein, an anatomic variant.

In the pelvis, the urinary bladder is midline with normal wall
thickness. There is no pelvic mass or fluid collection. There is no
pelvic mass or pelvic fluid collection. Appendix appears normal.

There is no bowel obstruction. No free air or portal venous air.
There is a minimal ventral hernia containing only fat.

There is no ascites, adenopathy, or abscess in the abdomen or
pelvis. There is no lesion involving the perineum. No lesion
involving the abdominal or pelvic wall. There is no demonstrable
abdominal aortic aneurysm. There are no blastic or lytic bone
lesions. There is lumbar levoscoliosis.
IMPRESSION: 1 mm nonobstructing calculus mid left kidney. No hydronephrosis on
either side. No ureteral calculi on either side.

No mesenteric inflammation. No abscess. No lesion involving the
perineum.

No bowel obstruction.  Appendix appears normal.

## 2022-06-07 ENCOUNTER — Encounter: Payer: Self-pay | Admitting: Adult Health

## 2022-06-07 ENCOUNTER — Ambulatory Visit (INDEPENDENT_AMBULATORY_CARE_PROVIDER_SITE_OTHER): Payer: 59 | Admitting: Adult Health

## 2022-06-07 VITALS — BP 120/80 | HR 60 | Temp 98.0°F | Ht 73.0 in | Wt 188.0 lb

## 2022-06-07 DIAGNOSIS — Z0000 Encounter for general adult medical examination without abnormal findings: Secondary | ICD-10-CM | POA: Diagnosis not present

## 2022-06-07 LAB — COMPREHENSIVE METABOLIC PANEL
ALT: 18 U/L (ref 0–53)
AST: 18 U/L (ref 0–37)
Albumin: 4.9 g/dL (ref 3.5–5.2)
Alkaline Phosphatase: 80 U/L (ref 39–117)
BUN: 18 mg/dL (ref 6–23)
CO2: 28 mEq/L (ref 19–32)
Calcium: 10.1 mg/dL (ref 8.4–10.5)
Chloride: 103 mEq/L (ref 96–112)
Creatinine, Ser: 0.92 mg/dL (ref 0.40–1.50)
GFR: 104.45 mL/min (ref >60.00)
Glucose, Bld: 87 mg/dL (ref 70–99)
Potassium: 4.5 mEq/L (ref 3.5–5.1)
Sodium: 139 mEq/L (ref 135–145)
Total Bilirubin: 0.7 mg/dL (ref 0.2–1.2)
Total Protein: 7.9 g/dL (ref 6.0–8.3)

## 2022-06-07 LAB — LIPID PANEL
Cholesterol: 259 mg/dL — ABNORMAL HIGH (ref 0–200)
HDL: 59.6 mg/dL (ref >39.00)
LDL Cholesterol: 187 mg/dL — ABNORMAL HIGH (ref 0–99)
NonHDL: 199.89
Total CHOL/HDL Ratio: 4
Triglycerides: 66 mg/dL (ref 0.0–149.0)
VLDL: 13.2 mg/dL (ref 0.0–40.0)

## 2022-06-07 LAB — CBC WITH DIFFERENTIAL/PLATELET
Basophils Absolute: 0 10*3/uL (ref 0.0–0.1)
Basophils Relative: 0.4 % (ref 0.0–3.0)
Eosinophils Absolute: 0.1 10*3/uL (ref 0.0–0.7)
Eosinophils Relative: 3.7 % (ref 0.0–5.0)
HCT: 45.6 % (ref 39.0–52.0)
Hemoglobin: 15.7 g/dL (ref 13.0–17.0)
Lymphocytes Relative: 44.2 % (ref 12.0–46.0)
Lymphs Abs: 1.7 10*3/uL (ref 0.7–4.0)
MCHC: 34.5 g/dL (ref 30.0–36.0)
MCV: 88.5 fl (ref 78.0–100.0)
Monocytes Absolute: 0.4 10*3/uL (ref 0.1–1.0)
Monocytes Relative: 9.2 % (ref 3.0–12.0)
Neutro Abs: 1.7 10*3/uL (ref 1.4–7.7)
Neutrophils Relative %: 42.5 % — ABNORMAL LOW (ref 43.0–77.0)
Platelets: 210 10*3/uL (ref 150.0–400.0)
RBC: 5.16 Mil/uL (ref 4.22–5.81)
RDW: 12.8 % (ref 11.5–15.5)
WBC: 3.9 10*3/uL — ABNORMAL LOW (ref 4.0–10.5)

## 2022-06-07 LAB — HEMOGLOBIN A1C: Hgb A1c MFr Bld: 5.2 % (ref 4.6–6.5)

## 2022-06-07 LAB — TSH: TSH: 1.55 u[IU]/mL (ref 0.35–5.50)

## 2022-06-07 NOTE — Patient Instructions (Addendum)
It was great seeing you today, you have very clean ears!   Please work on increasing water, cutting back on alcohol and doing more weight training   We will follow up with you regarding your lab work   Please let me know if you need anything   [facility]  Address: [address] [place], [place], [place] [address] Products and Services: [email or web address] Phone: [phone]

## 2022-06-07 NOTE — Progress Notes (Signed)
Patient presents to clinic today to establish care.  Acute Concerns: Establish Care/Annual Exam   Chronic Issues: None   Health Maintenance: Dental -- Does not do routine care Vision -- Routine Care  Immunizations -- refuses flu. Tdap UTD Colonoscopy -- Never  Diet: Does not follow a specific diet  Exercise: Enjoys playing pickle ball but does not do weight training    History reviewed. No pertinent past medical history.  History reviewed. No pertinent surgical history.  No current outpatient medications on file prior to visit.   No current facility-administered medications on file prior to visit.    No Known Allergies  Family History  Problem Relation Age of Onset   Hypertension Brother     Social History   Socioeconomic History   Marital status: Married    Spouse name: Not on file   Number of children: Not on file   Years of education: Not on file   Highest education level: Not on file  Occupational History   Not on file  Tobacco Use   Smoking status: Never   Smokeless tobacco: Not on file  Substance and Sexual Activity   Alcohol use: Yes   Drug use: No   Sexual activity: Not on file  Other Topics Concern   Not on file  Social History Narrative   Not on file   Social Determinants of Health   Financial Resource Strain: Not on file  Food Insecurity: Not on file  Transportation Needs: Not on file  Physical Activity: Not on file  Stress: Not on file  Social Connections: Not on file  Intimate Partner Violence: Not on file    Review of Systems  Constitutional: Negative.   HENT: Negative.    Eyes: Negative.   Respiratory: Negative.    Cardiovascular: Negative.   Gastrointestinal: Negative.   Genitourinary: Negative.   Musculoskeletal: Negative.   Skin: Negative.   Neurological: Negative.   Endo/Heme/Allergies: Negative.   Psychiatric/Behavioral: Negative.    All other systems reviewed and are negative.   BP 120/80   Pulse 60   Temp  98 F (36.7 C) (Oral)   Ht 6\' 1"  (1.854 m)   Wt 188 lb (85.3 kg)   SpO2 97%   BMI 24.80 kg/m   Physical Exam Vitals and nursing note reviewed.  Constitutional:      General: He is not in acute distress.    Appearance: Normal appearance. He is well-developed. He is obese.  HENT:     Head: Normocephalic and atraumatic.     Right Ear: Tympanic membrane, ear canal and external ear normal. There is no impacted cerumen.     Left Ear: Tympanic membrane, ear canal and external ear normal. There is no impacted cerumen.     Nose: Nose normal. No congestion or rhinorrhea.     Mouth/Throat:     Mouth: Mucous membranes are moist.     Pharynx: Oropharynx is clear. No oropharyngeal exudate or posterior oropharyngeal erythema.  Eyes:     General:        Right eye: No discharge.        Left eye: No discharge.     Extraocular Movements: Extraocular movements intact.     Conjunctiva/sclera: Conjunctivae normal.     Pupils: Pupils are equal, round, and reactive to light.  Neck:     Vascular: No carotid bruit.     Trachea: No tracheal deviation.  Cardiovascular:     Rate and Rhythm: Normal rate and regular  rhythm.     Pulses: Normal pulses.     Heart sounds: Normal heart sounds. No murmur heard.    No friction rub. No gallop.  Pulmonary:     Effort: Pulmonary effort is normal. No respiratory distress.     Breath sounds: Normal breath sounds. No stridor. No wheezing, rhonchi or rales.  Chest:     Chest wall: No tenderness.  Abdominal:     General: Bowel sounds are normal. There is no distension.     Palpations: Abdomen is soft. There is no mass.     Tenderness: There is no abdominal tenderness. There is no right CVA tenderness, left CVA tenderness, guarding or rebound.     Hernia: No hernia is present.  Musculoskeletal:        General: No swelling, tenderness, deformity or signs of injury. Normal range of motion.     Right lower leg: No edema.     Left lower leg: No edema.   Lymphadenopathy:     Cervical: No cervical adenopathy.  Skin:    General: Skin is warm and dry.     Capillary Refill: Capillary refill takes less than 2 seconds.     Coloration: Skin is not jaundiced or pale.     Findings: No bruising, erythema, lesion or rash.  Neurological:     General: No focal deficit present.     Mental Status: He is alert and oriented to person, place, and time.     Cranial Nerves: No cranial nerve deficit.     Sensory: No sensory deficit.     Motor: No weakness.     Coordination: Coordination normal.     Gait: Gait normal.     Deep Tendon Reflexes: Reflexes normal.  Psychiatric:        Mood and Affect: Mood normal.        Behavior: Behavior normal.        Thought Content: Thought content normal.        Judgment: Judgment normal.      Assessment/Plan: 1. Routine general medical examination at a health care facility - Benign exam. Healthy 41 year old male - Follow up in one year  - Increase water consumption and work on [employer] training - CBC with Differential/Platelet; Future - Comprehensive metabolic panel; Future - Hemoglobin A1c; Future - Lipid panel; Future - TSH; Future    Shirline Frees, NP

## 2022-06-20 ENCOUNTER — Encounter: Payer: Self-pay | Admitting: Adult Health

## 2023-06-24 ENCOUNTER — Encounter: Payer: Self-pay | Admitting: Adult Health

## 2023-06-24 ENCOUNTER — Ambulatory Visit (INDEPENDENT_AMBULATORY_CARE_PROVIDER_SITE_OTHER): Payer: 59 | Admitting: Adult Health

## 2023-06-24 VITALS — BP 120/80 | HR 64 | Temp 97.8°F | Ht 72.0 in | Wt 188.0 lb

## 2023-06-24 DIAGNOSIS — Z1159 Encounter for screening for other viral diseases: Secondary | ICD-10-CM | POA: Diagnosis not present

## 2023-06-24 DIAGNOSIS — Z0000 Encounter for general adult medical examination without abnormal findings: Secondary | ICD-10-CM

## 2023-06-24 DIAGNOSIS — Z23 Encounter for immunization: Secondary | ICD-10-CM

## 2023-06-24 DIAGNOSIS — E782 Mixed hyperlipidemia: Secondary | ICD-10-CM | POA: Diagnosis not present

## 2023-06-24 LAB — LIPID PANEL
Cholesterol: 227 mg/dL — ABNORMAL HIGH (ref 0–200)
HDL: 62.4 mg/dL (ref >39.00)
LDL Cholesterol: 156 mg/dL — ABNORMAL HIGH (ref 0–99)
NonHDL: 164.35
Total CHOL/HDL Ratio: 4
Triglycerides: 40 mg/dL (ref 0.0–149.0)
VLDL: 8 mg/dL (ref 0.0–40.0)

## 2023-06-24 LAB — CBC
HCT: 44.2 % (ref 39.0–52.0)
Hemoglobin: 14.9 g/dL (ref 13.0–17.0)
MCHC: 33.7 g/dL (ref 30.0–36.0)
MCV: 89.2 fl (ref 78.0–100.0)
Platelets: 185 10*3/uL (ref 150.0–400.0)
RBC: 4.95 Mil/uL (ref 4.22–5.81)
RDW: 12.5 % (ref 11.5–15.5)
WBC: 3.5 10*3/uL — ABNORMAL LOW (ref 4.0–10.5)

## 2023-06-24 LAB — COMPREHENSIVE METABOLIC PANEL
ALT: 17 U/L (ref 0–53)
AST: 17 U/L (ref 0–37)
Albumin: 4.6 g/dL (ref 3.5–5.2)
Alkaline Phosphatase: 64 U/L (ref 39–117)
BUN: 14 mg/dL (ref 6–23)
CO2: 28 mEq/L (ref 19–32)
Calcium: 9.6 mg/dL (ref 8.4–10.5)
Chloride: 105 mEq/L (ref 96–112)
Creatinine, Ser: 0.82 mg/dL (ref 0.40–1.50)
GFR: 109.49 mL/min (ref >60.00)
Glucose, Bld: 91 mg/dL (ref 70–99)
Potassium: 4.2 mEq/L (ref 3.5–5.1)
Sodium: 140 mEq/L (ref 135–145)
Total Bilirubin: 0.6 mg/dL (ref 0.2–1.2)
Total Protein: 7.3 g/dL (ref 6.0–8.3)

## 2023-06-24 NOTE — Progress Notes (Signed)
Subjective:    Patient ID: Benjamin Jarvis, male    DOB: 07/21/1981, 42 y.o.   MRN: [id number]  HPI Patient presents for yearly preventative medicine examination. He is a pleasant 42 year old male who  has a past medical history of Chicken pox and Kidney stones.  Hyperlipidemia - elevated total cholesterol and LDL in the past. Not currently on any medication.  Lab Results  Component Value Date   CHOL 259 (H) 06/07/2022   HDL 59.60 06/07/2022   LDLCALC 187 (H) 06/07/2022   TRIG 66.0 06/07/2022   CHOLHDL 4 06/07/2022   All immunizations and health maintenance protocols were reviewed with the patient and needed orders were placed. He is due for flu vaccination.   Appropriate screening laboratory values were ordered for the patient including screening of hyperlipidemia, renal function and hepatic function.  Medication reconciliation,  past medical history, social history, problem list and allergies were reviewed in detail with the patient  Goals were established with regard to weight loss, exercise, and  diet in compliance with medications. He plays pickle ball on a regular basis. He is eating less red meat and has cut back on alcoholic beverages ( he will have 1-2 drinks a week).   Wt Readings from Last 3 Encounters:  06/24/23 188 lb (85.3 kg)  06/07/22 188 lb (85.3 kg)  10/15/14 182 lb 3.2 oz (82.6 kg)    Review of Systems  Constitutional: Negative.   HENT: Negative.    Eyes: Negative.   Respiratory: Negative.    Cardiovascular: Negative.   Gastrointestinal: Negative.   Endocrine: Negative.   Genitourinary: Negative.   Musculoskeletal: Negative.   Skin: Negative.   Allergic/Immunologic: Negative.   Neurological: Negative.   Hematological: Negative.   Psychiatric/Behavioral: Negative.    All other systems reviewed and are negative.  Past Medical History:  Diagnosis Date   Chicken pox    Kidney stones     Social History   Socioeconomic History   Marital  status: Married    Spouse name: Not on file   Number of children: Not on file   Years of education: Not on file   Highest education level: Not on file  Occupational History   Not on file  Tobacco Use   Smoking status: Never   Smokeless tobacco: Never  Substance and Sexual Activity   Alcohol use: Yes    Alcohol/week: 7.0 standard drinks of alcohol    Types: 7 Cans of beer per week   Drug use: No   Sexual activity: Yes  Other Topics Concern   Not on file  Social History Narrative   Not on file   Social Determinants of Health   Financial Resource Strain: Not on file  Food Insecurity: Not on file  Transportation Needs: Not on file  Physical Activity: Not on file  Stress: Not on file  Social Connections: Not on file  Intimate Partner Violence: Not on file    History reviewed. No pertinent surgical history.  Family History  Problem Relation Age of Onset   Hypertension Mother    Pulmonary fibrosis Mother    Hyperlipidemia Sister    Hypertension Brother     No Known Allergies  No current outpatient medications on file prior to visit.   No current facility-administered medications on file prior to visit.    BP 120/80   Pulse 64   Temp 97.8 F (36.6 C) (Oral)   Ht 6' (1.829 m)   Wt 188 lb (85.3  kg)   SpO2 98%   BMI 25.50 kg/m       Objective:   Physical Exam Vitals and nursing note reviewed.  Constitutional:      General: He is not in acute distress.    Appearance: Normal appearance. He is not ill-appearing.  HENT:     Head: Normocephalic and atraumatic.     Right Ear: Tympanic membrane, ear canal and external ear normal. There is no impacted cerumen.     Left Ear: Tympanic membrane, ear canal and external ear normal. There is no impacted cerumen.     Nose: Nose normal. No congestion or rhinorrhea.     Mouth/Throat:     Mouth: Mucous membranes are moist.     Pharynx: Oropharynx is clear.  Eyes:     Extraocular Movements: Extraocular movements intact.      Conjunctiva/sclera: Conjunctivae normal.     Pupils: Pupils are equal, round, and reactive to light.  Neck:     Vascular: No carotid bruit.  Cardiovascular:     Rate and Rhythm: Normal rate and regular rhythm.     Pulses: Normal pulses.     Heart sounds: No murmur heard.    No friction rub. No gallop.  Pulmonary:     Effort: Pulmonary effort is normal.     Breath sounds: Normal breath sounds.  Abdominal:     General: Abdomen is flat. Bowel sounds are normal. There is no distension.     Palpations: Abdomen is soft. There is no mass.     Tenderness: There is no abdominal tenderness. There is no guarding or rebound.     Hernia: No hernia is present.  Musculoskeletal:        General: Normal range of motion.     Cervical back: Normal range of motion and neck supple.  Lymphadenopathy:     Cervical: No cervical adenopathy.  Skin:    General: Skin is warm and dry.     Capillary Refill: Capillary refill takes less than 2 seconds.  Neurological:     General: No focal deficit present.     Mental Status: He is alert and oriented to person, place, and time.  Psychiatric:        Mood and Affect: Mood normal.        Behavior: Behavior normal.        Thought Content: Thought content normal.        Judgment: Judgment normal.       Assessment & Plan:  1. Routine general medical examination at a health care facility Today patient counseled on age appropriate routine health concerns for screening and prevention, each reviewed and up to date or declined. Immunizations reviewed and up to date or declined. Labs ordered and reviewed. Risk factors for depression reviewed and negative. Hearing function and visual acuity are intact. ADLs screened and addressed as needed. Functional ability and level of safety reviewed and appropriate. Education, counseling and referrals performed based on assessed risks today. Patient provided with a copy of personalized plan for preventive services.  2. Need for  hepatitis C screening test  - Hep C Antibody; Future  3. Mixed hyperlipidemia The 10-year ASCVD risk score (Arnett DK, et al., 2019) is: 1.5%   Values used to calculate the score:     Age: 76 years     Sex: Male     Is Non-Hispanic African American: No     Diabetic: No     Tobacco smoker: No  Systolic Blood Pressure: 120 mmHg     Is BP treated: No     HDL Cholesterol: 59.6 mg/dL     Total Cholesterol: 259 mg/dL  - Hep C Antibody; Future - Lipid panel; Future - CBC; Future - Comprehensive metabolic panel; Future  4. Need for tetanus booster  - Tdap vaccine greater than or equal to 7yo IM  Shirline Frees, NP

## 2023-06-25 LAB — HEPATITIS C ANTIBODY: Hepatitis C Ab: NONREACTIVE

## 2023-12-09 DIAGNOSIS — F4323 Adjustment disorder with mixed anxiety and depressed mood: Secondary | ICD-10-CM | POA: Diagnosis not present

## 2024-04-28 DIAGNOSIS — S93491A Sprain of other ligament of right ankle, initial encounter: Secondary | ICD-10-CM | POA: Diagnosis not present

## 2024-06-30 ENCOUNTER — Ambulatory Visit (INDEPENDENT_AMBULATORY_CARE_PROVIDER_SITE_OTHER): Admitting: Adult Health

## 2024-06-30 ENCOUNTER — Encounter: Payer: Self-pay | Admitting: Adult Health

## 2024-06-30 VITALS — BP 120/70 | HR 59 | Temp 97.6°F | Ht 73.0 in | Wt 189.0 lb

## 2024-06-30 DIAGNOSIS — E782 Mixed hyperlipidemia: Secondary | ICD-10-CM

## 2024-06-30 DIAGNOSIS — Z0000 Encounter for general adult medical examination without abnormal findings: Secondary | ICD-10-CM

## 2024-06-30 LAB — LIPID PANEL
Cholesterol: 232 mg/dL — ABNORMAL HIGH (ref 0–200)
HDL: 63.4 mg/dL (ref >39.00)
LDL Cholesterol: 153 mg/dL — ABNORMAL HIGH (ref 0–99)
NonHDL: 168.2
Total CHOL/HDL Ratio: 4
Triglycerides: 77 mg/dL (ref 0.0–149.0)
VLDL: 15.4 mg/dL (ref 0.0–40.0)

## 2024-06-30 LAB — CBC
HCT: 43.9 % (ref 39.0–52.0)
Hemoglobin: 15 g/dL (ref 13.0–17.0)
MCHC: 34.1 g/dL (ref 30.0–36.0)
MCV: 89.6 fl (ref 78.0–100.0)
Platelets: 191 K/uL (ref 150.0–400.0)
RBC: 4.9 Mil/uL (ref 4.22–5.81)
RDW: 12.9 % (ref 11.5–15.5)
WBC: 3.8 K/uL — ABNORMAL LOW (ref 4.0–10.5)

## 2024-06-30 LAB — COMPREHENSIVE METABOLIC PANEL WITH GFR
ALT: 18 U/L (ref 0–53)
AST: 18 U/L (ref 0–37)
Albumin: 4.8 g/dL (ref 3.5–5.2)
Alkaline Phosphatase: 69 U/L (ref 39–117)
BUN: 16 mg/dL (ref 6–23)
CO2: 29 meq/L (ref 19–32)
Calcium: 9.9 mg/dL (ref 8.4–10.5)
Chloride: 104 meq/L (ref 96–112)
Creatinine, Ser: 0.85 mg/dL (ref 0.40–1.50)
GFR: 107.54 mL/min (ref >60.00)
Glucose, Bld: 85 mg/dL (ref 70–99)
Potassium: 4.4 meq/L (ref 3.5–5.1)
Sodium: 140 meq/L (ref 135–145)
Total Bilirubin: 0.7 mg/dL (ref 0.2–1.2)
Total Protein: 7.5 g/dL (ref 6.0–8.3)

## 2024-06-30 NOTE — Patient Instructions (Addendum)
 It was great seeing you today   We will follow up with you regarding your lab work   Please let me know if you need anything   Your ears are completely clean

## 2024-06-30 NOTE — Progress Notes (Signed)
 Subjective:    Patient ID: Benjamin Jarvis, male    DOB: 06/05/1981, 43 y.o.   MRN: [id number]  HPI Patient presents for yearly preventative medicine examination. He is a pleasant 43 year old male who  has a past medical history of Chicken pox and Kidney stones.  Hyperlipidemia - elevated total cholesterol and LDL in the past. Not currently on any medication.  Lab Results  Component Value Date   CHOL 227 (H) 06/24/2023   HDL 62.40 06/24/2023   LDLCALC 156 (H) 06/24/2023   TRIG 40.0 06/24/2023   CHOLHDL 4 06/24/2023   All immunizations and health maintenance protocols were reviewed with the patient and needed orders were placed. Refused vaccinations today.   Appropriate screening laboratory values were ordered for the patient including screening of hyperlipidemia, renal function and hepatic function. If indicated by BPH, a PSA was ordered.  Medication reconciliation,  past medical history, social history, problem list and allergies were reviewed in detail with the patient  Goals were established with regard to weight loss, exercise, and  diet in compliance with medications. He has been going to the gym 2-3 times a week and playin pickleball 3x a week.  He is eating less red meat and drinking less alcohol. Overall feels much better and sleeping better.  Wt Readings from Last 3 Encounters:  06/30/24 189 lb (85.7 kg)  06/24/23 188 lb (85.3 kg)  06/07/22 188 lb (85.3 kg)   Review of Systems  Constitutional: Negative.   HENT: Negative.    Eyes: Negative.   Respiratory: Negative.    Cardiovascular: Negative.   Gastrointestinal: Negative.   Endocrine: Negative.   Genitourinary: Negative.   Musculoskeletal: Negative.   Skin: Negative.   Allergic/Immunologic: Negative.   Neurological: Negative.   Hematological: Negative.   Psychiatric/Behavioral: Negative.    All other systems reviewed and are negative.  Past Medical History:  Diagnosis Date   Chicken pox    Kidney  stones     Social History   Socioeconomic History   Marital status: Married    Spouse name: Not on file   Number of children: Not on file   Years of education: Not on file   Highest education level: Bachelor's degree (e.g., BA, AB, BS)  Occupational History   Not on file  Tobacco Use   Smoking status: Never   Smokeless tobacco: Never  Substance and Sexual Activity   Alcohol use: Yes    Alcohol/week: 7.0 standard drinks of alcohol    Types: 7 Cans of beer per week   Drug use: No   Sexual activity: Yes  Other Topics Concern   Not on file  Social History Narrative   Not on file   Social Drivers of Health   Financial Resource Strain: Low Risk  (06/28/2024)   Overall Financial Resource Strain (CARDIA)    Difficulty of Paying Living Expenses: Not hard at all  Food Insecurity: No Food Insecurity (06/28/2024)   Hunger Vital Sign    Worried About Running Out of Food in the Last Year: Never true    Ran Out of Food in the Last Year: Never true  Transportation Needs: No Transportation Needs (06/28/2024)   PRAPARE - Administrator, Civil Service (Medical): No    Lack of Transportation (Non-Medical): No  Physical Activity: Sufficiently Active (06/28/2024)   Exercise Vital Sign    Days of Exercise per Week: 4 days    Minutes of Exercise per Session: 80 min  Stress:  No Stress Concern Present (06/28/2024)   [employer] of Occupational Health - Occupational Stress Questionnaire    Feeling of Stress: Not at all  Social Connections: Socially Integrated (06/28/2024)   Social Connection and Isolation Panel    Frequency of Communication with Friends and Family: More than three times a week    Frequency of Social Gatherings with Friends and Family: Twice a week    Attends Religious Services: More than 4 times per year    Active Member of [employer] or Organizations: Yes    Attends Engineer, structural: More than 4 times per year    Marital Status: Married  Careers information officer Violence: Not on file    History reviewed. No pertinent surgical history.  Family History  Problem Relation Age of Onset   Hypertension Mother    Pulmonary fibrosis Mother    Hyperlipidemia Sister    Hypertension Brother     No Known Allergies  No current outpatient medications on file prior to visit.   No current facility-administered medications on file prior to visit.    BP 120/70   Pulse (!) 59   Temp 97.6 F (36.4 C) (Oral)   Ht 6' 1 (1.854 m)   Wt 189 lb (85.7 kg)   SpO2 97%   BMI 24.94 kg/m       Objective:   Physical Exam Vitals and nursing note reviewed.  Constitutional:      General: He is not in acute distress.    Appearance: Normal appearance. He is not ill-appearing.  HENT:     Head: Normocephalic and atraumatic.     Right Ear: Tympanic membrane, ear canal and external ear normal. There is no impacted cerumen.     Left Ear: Tympanic membrane, ear canal and external ear normal. There is no impacted cerumen.     Nose: Nose normal. No congestion or rhinorrhea.     Mouth/Throat:     Mouth: Mucous membranes are moist.     Pharynx: Oropharynx is clear.  Eyes:     Extraocular Movements: Extraocular movements intact.     Conjunctiva/sclera: Conjunctivae normal.     Pupils: Pupils are equal, round, and reactive to light.  Neck:     Vascular: No carotid bruit.  Cardiovascular:     Rate and Rhythm: Normal rate and regular rhythm.     Pulses: Normal pulses.     Heart sounds: No murmur heard.    No friction rub. No gallop.  Pulmonary:     Effort: Pulmonary effort is normal.     Breath sounds: Normal breath sounds.  Abdominal:     General: Abdomen is flat. Bowel sounds are normal. There is no distension.     Palpations: Abdomen is soft. There is no mass.     Tenderness: There is no abdominal tenderness. There is no guarding or rebound.     Hernia: No hernia is present.  Musculoskeletal:        General: Normal range of motion.     Cervical  back: Normal range of motion and neck supple.  Lymphadenopathy:     Cervical: No cervical adenopathy.  Skin:    General: Skin is warm and dry.     Capillary Refill: Capillary refill takes less than 2 seconds.  Neurological:     General: No focal deficit present.     Mental Status: He is alert and oriented to person, place, and time.  Psychiatric:        Mood and  Affect: Mood normal.        Behavior: Behavior normal.        Thought Content: Thought content normal.        Judgment: Judgment normal.       Assessment & Plan:  1. Routine general medical examination at a health care facility (Primary) Today patient counseled on age appropriate routine health concerns for screening and prevention, each reviewed and up to date or declined. Immunizations reviewed and up to date or declined. Labs ordered and reviewed. Risk factors for depression reviewed and negative. Hearing function and visual acuity are intact. ADLs screened and addressed as needed. Functional ability and level of safety reviewed and appropriate. Education, counseling and referrals performed based on assessed risks today. Patient provided with a copy of personalized plan for preventive services. - Benign exam, healthy 43 year old male - Continue to eat healthy and exercise - Follow up in one year or sooner if needed  2. Mixed hyperlipidemia - Consider statin  - Lipid panel; Future - CBC; Future - Comprehensive metabolic panel with GFR; Future  Darleene Shape, NP

## 2024-07-01 ENCOUNTER — Ambulatory Visit: Payer: Self-pay | Admitting: Adult Health

## 2024-07-20 ENCOUNTER — Encounter: Payer: Self-pay | Admitting: Adult Health

## 2024-07-21 ENCOUNTER — Encounter: Payer: Self-pay | Admitting: Adult Health

## 2024-07-21 NOTE — Telephone Encounter (Signed)
**Note De-identified  [place] Obfuscation** Please advise
